# Patient Record
Sex: Female | Born: 1950
Health system: Southern US, Community
[De-identification: ages and names within clinical notes are randomized; demographics above are authoritative.]

## PROBLEM LIST (undated history)

## (undated) DIAGNOSIS — M810 Age-related osteoporosis without current pathological fracture: Secondary | ICD-10-CM

## (undated) DIAGNOSIS — I10 Essential (primary) hypertension: Secondary | ICD-10-CM

## (undated) DIAGNOSIS — E119 Type 2 diabetes mellitus without complications: Secondary | ICD-10-CM

## (undated) DIAGNOSIS — E785 Hyperlipidemia, unspecified: Secondary | ICD-10-CM

## (undated) DIAGNOSIS — R519 Headache, unspecified: Secondary | ICD-10-CM

## (undated) DIAGNOSIS — R51 Headache: Secondary | ICD-10-CM

## (undated) HISTORY — DX: Type 2 diabetes mellitus without complications: E11.9

## (undated) HISTORY — DX: Headache, unspecified: R51.9

## (undated) HISTORY — PX: EYE SURGERY: SHX253

## (undated) HISTORY — DX: Hyperlipidemia, unspecified: E78.5

## (undated) HISTORY — DX: Age-related osteoporosis without current pathological fracture: M81.0

## (undated) HISTORY — DX: Headache: R51

---

## 1998-10-10 ENCOUNTER — Encounter: Admission: RE | Admit: 1998-10-10 | Discharge: 1999-01-08 | Payer: Self-pay | Admitting: Family Medicine

## 1999-04-24 ENCOUNTER — Other Ambulatory Visit: Admission: RE | Admit: 1999-04-24 | Discharge: 1999-04-24 | Payer: Self-pay

## 2000-07-03 ENCOUNTER — Other Ambulatory Visit: Admission: RE | Admit: 2000-07-03 | Discharge: 2000-07-03 | Payer: Self-pay | Admitting: Family Medicine

## 2000-09-11 ENCOUNTER — Encounter: Payer: Self-pay | Admitting: Family Medicine

## 2000-09-11 ENCOUNTER — Encounter: Admission: RE | Admit: 2000-09-11 | Discharge: 2000-09-11 | Payer: Self-pay | Admitting: Family Medicine

## 2001-05-11 ENCOUNTER — Encounter: Payer: Self-pay | Admitting: Family Medicine

## 2001-05-11 ENCOUNTER — Encounter: Admission: RE | Admit: 2001-05-11 | Discharge: 2001-05-11 | Payer: Self-pay | Admitting: Family Medicine

## 2004-01-10 ENCOUNTER — Ambulatory Visit: Payer: Self-pay | Admitting: Gastroenterology

## 2004-01-23 ENCOUNTER — Ambulatory Visit: Payer: Self-pay | Admitting: Gastroenterology

## 2004-01-30 ENCOUNTER — Ambulatory Visit: Payer: Self-pay | Admitting: Gastroenterology

## 2004-10-22 ENCOUNTER — Emergency Department (HOSPITAL_COMMUNITY): Admission: EM | Admit: 2004-10-22 | Discharge: 2004-10-23 | Payer: Self-pay | Admitting: Emergency Medicine

## 2006-11-09 IMAGING — CT CT ANGIO CHEST
2 of 3 series · 8 of 33 positions shown · IV contrast (omnipaque)
Comparison: none

CLINICAL DATA: Chest pain.
 CT ANGIOGRAPHY OF CHEST:
TECHNIQUE: Multidetector CT imaging of the chest was performed during bolus injection of intravenous contrast.  Multiplanar CT angiographic image reconstructions were generated to evaluate the vascular anatomy.
 Contrast:  80 cc Omnipaque 300.
 There are no filling defects within the pulmonary arterial system to suggest pulmonary emboli.  There is no evidence of aortic dissection or aneurysm.  The lungs are clear except for minimal dependent and basilar atelectasis.  Indeterminate 6-mm right thyroid nodule is noted.  Consider ultrasound follow-up.  No enlarged lymph nodes are present.  No evidence of pleural or pericardial effusions.

[Series 2: pe 3.0 b40f st · axial · 0.52mm/px · z∈[+1236,+1245]mm · 2 of 81 slices shown]
[im 38/81  lung]
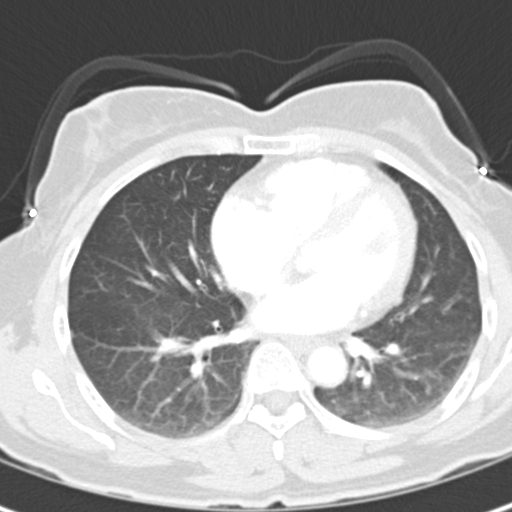
[im 41/81  lung]
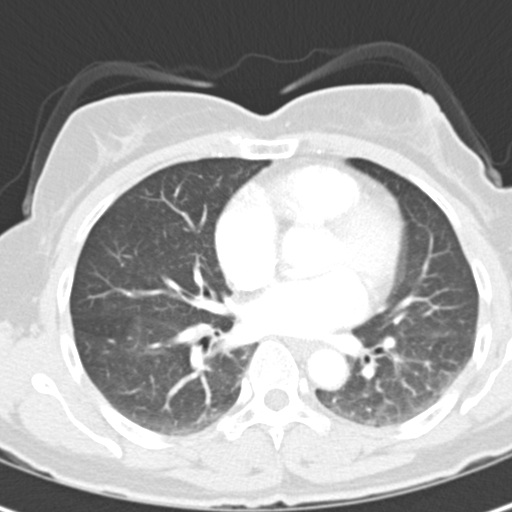

[Series 3: pe 1.0 b20f st · axial · 0.52mm/px · z∈[+1162,+1328]mm · 6 of 250 slices shown]
[im 42/250  lung]
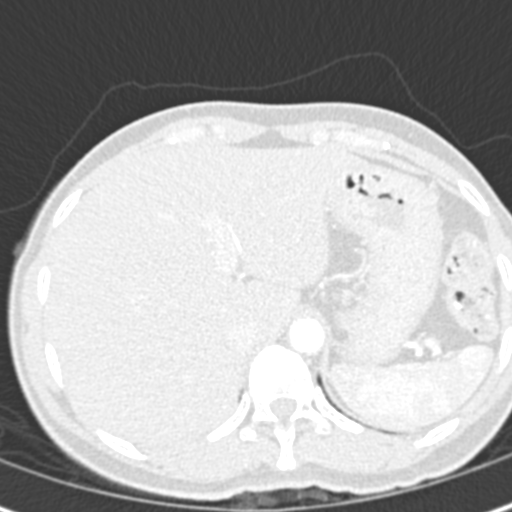
[im 84/250  mediastinal]
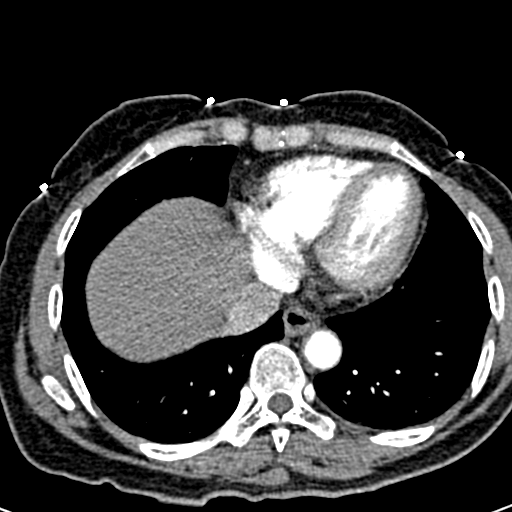
[im 116/250  lung]
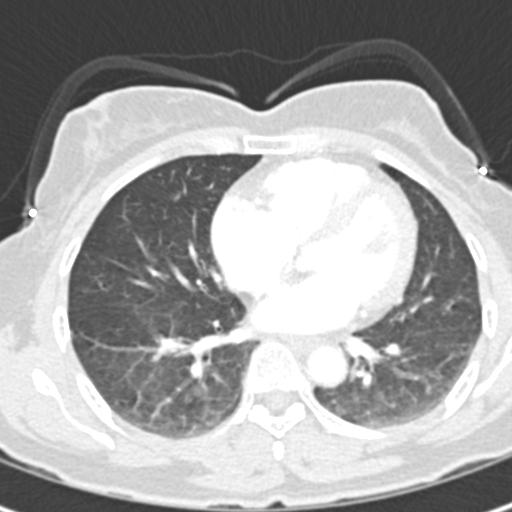
[im 125/250  mediastinal]
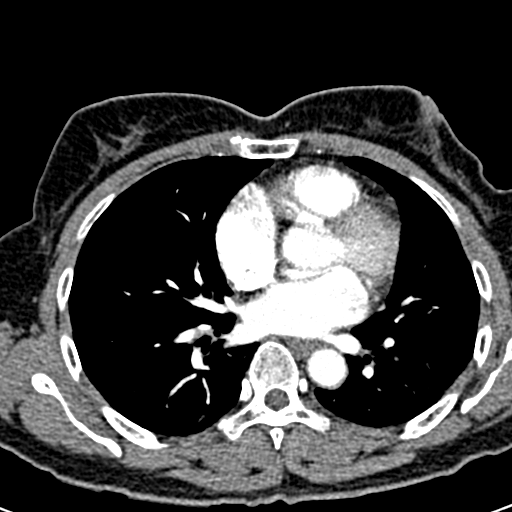
[im 167/250  lung]
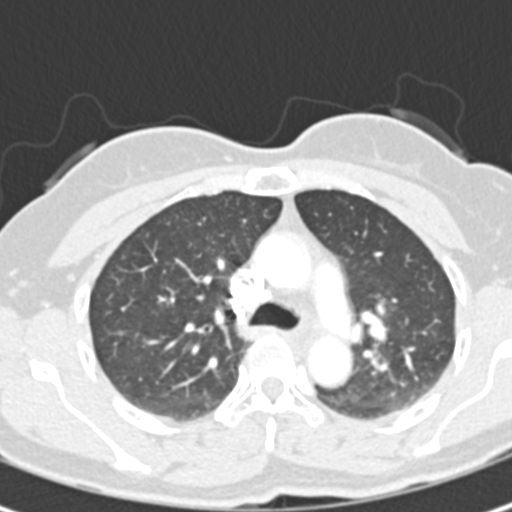
[im 208/250  mediastinal]
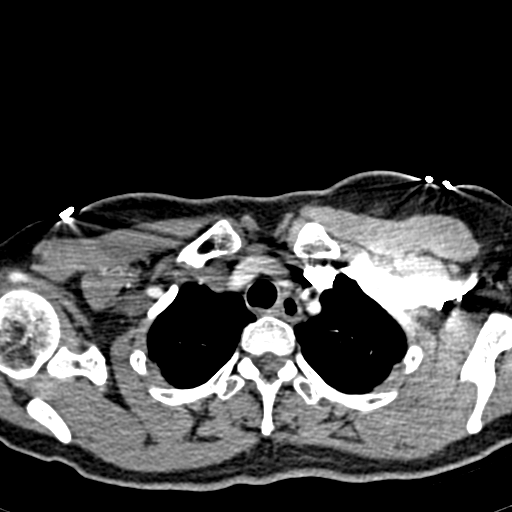

[8 of 33 positions shown; findings below may reference images not displayed]

IMPRESSION: 1.  No evidence of acute abnormality--no evidence of pulmonary emboli or aortic dissection.
 2.  Indeterminate 6-mm right thyroid nodule, consider ultrasound follow-up.

## 2012-09-14 ENCOUNTER — Other Ambulatory Visit: Payer: Self-pay

## 2012-09-14 DIAGNOSIS — Z1231 Encounter for screening mammogram for malignant neoplasm of breast: Secondary | ICD-10-CM

## 2012-10-05 ENCOUNTER — Ambulatory Visit
Admission: RE | Admit: 2012-10-05 | Discharge: 2012-10-05 | Disposition: A | Discharge status: Home / Self Care | Payer: BC Managed Care – PPO | Source: Ambulatory Visit

## 2012-10-05 DIAGNOSIS — Z1231 Encounter for screening mammogram for malignant neoplasm of breast: Secondary | ICD-10-CM

## 2013-11-02 ENCOUNTER — Other Ambulatory Visit (HOSPITAL_COMMUNITY)
Admission: RE | Admit: 2013-11-02 | Discharge: 2013-11-02 | Disposition: A | Discharge status: Home / Self Care | Payer: BC Managed Care – PPO | Source: Ambulatory Visit | Attending: Family Medicine | Admitting: Family Medicine

## 2013-11-02 ENCOUNTER — Other Ambulatory Visit: Payer: Self-pay | Admitting: Family Medicine

## 2013-11-02 DIAGNOSIS — Z1231 Encounter for screening mammogram for malignant neoplasm of breast: Secondary | ICD-10-CM

## 2013-11-02 DIAGNOSIS — Z124 Encounter for screening for malignant neoplasm of cervix: Secondary | ICD-10-CM | POA: Diagnosis present

## 2013-11-02 DIAGNOSIS — E2839 Other primary ovarian failure: Secondary | ICD-10-CM

## 2013-11-04 LAB — CYTOLOGY - PAP

## 2013-12-07 ENCOUNTER — Other Ambulatory Visit: Payer: BC Managed Care – PPO

## 2013-12-07 ENCOUNTER — Ambulatory Visit: Payer: BC Managed Care – PPO

## 2014-06-09 DIAGNOSIS — H9311 Tinnitus, right ear: Secondary | ICD-10-CM | POA: Diagnosis not present

## 2014-06-09 DIAGNOSIS — R809 Proteinuria, unspecified: Secondary | ICD-10-CM | POA: Diagnosis not present

## 2014-06-09 DIAGNOSIS — E780 Pure hypercholesterolemia: Secondary | ICD-10-CM | POA: Diagnosis not present

## 2014-06-09 DIAGNOSIS — R253 Fasciculation: Secondary | ICD-10-CM | POA: Diagnosis not present

## 2014-06-09 DIAGNOSIS — E782 Mixed hyperlipidemia: Secondary | ICD-10-CM | POA: Diagnosis not present

## 2014-06-09 DIAGNOSIS — E1129 Type 2 diabetes mellitus with other diabetic kidney complication: Secondary | ICD-10-CM | POA: Diagnosis not present

## 2014-06-13 DIAGNOSIS — E119 Type 2 diabetes mellitus without complications: Secondary | ICD-10-CM | POA: Diagnosis not present

## 2014-06-28 DIAGNOSIS — H6983 Other specified disorders of Eustachian tube, bilateral: Secondary | ICD-10-CM | POA: Diagnosis not present

## 2014-06-28 DIAGNOSIS — H908 Mixed conductive and sensorineural hearing loss, unspecified: Secondary | ICD-10-CM | POA: Diagnosis not present

## 2014-06-28 DIAGNOSIS — H9311 Tinnitus, right ear: Secondary | ICD-10-CM | POA: Diagnosis not present

## 2014-06-28 DIAGNOSIS — H903 Sensorineural hearing loss, bilateral: Secondary | ICD-10-CM | POA: Diagnosis not present

## 2014-09-08 DIAGNOSIS — Z1239 Encounter for other screening for malignant neoplasm of breast: Secondary | ICD-10-CM | POA: Diagnosis not present

## 2014-09-08 DIAGNOSIS — H9319 Tinnitus, unspecified ear: Secondary | ICD-10-CM | POA: Diagnosis not present

## 2014-09-08 DIAGNOSIS — Z1211 Encounter for screening for malignant neoplasm of colon: Secondary | ICD-10-CM | POA: Diagnosis not present

## 2014-09-08 DIAGNOSIS — E1129 Type 2 diabetes mellitus with other diabetic kidney complication: Secondary | ICD-10-CM | POA: Diagnosis not present

## 2014-09-08 DIAGNOSIS — E782 Mixed hyperlipidemia: Secondary | ICD-10-CM | POA: Diagnosis not present

## 2014-10-27 ENCOUNTER — Encounter: Payer: Self-pay | Admitting: Family Medicine

## 2014-10-27 DIAGNOSIS — H9319 Tinnitus, unspecified ear: Secondary | ICD-10-CM | POA: Diagnosis not present

## 2014-10-27 DIAGNOSIS — E1129 Type 2 diabetes mellitus with other diabetic kidney complication: Secondary | ICD-10-CM | POA: Diagnosis not present

## 2014-10-27 DIAGNOSIS — J029 Acute pharyngitis, unspecified: Secondary | ICD-10-CM | POA: Diagnosis not present

## 2014-10-27 DIAGNOSIS — Z23 Encounter for immunization: Secondary | ICD-10-CM | POA: Diagnosis not present

## 2014-10-27 DIAGNOSIS — R05 Cough: Secondary | ICD-10-CM | POA: Diagnosis not present

## 2014-10-27 DIAGNOSIS — E782 Mixed hyperlipidemia: Secondary | ICD-10-CM | POA: Diagnosis not present

## 2014-11-01 ENCOUNTER — Ambulatory Visit
Admission: RE | Admit: 2014-11-01 | Discharge: 2014-11-01 | Disposition: A | Discharge status: Home / Self Care | Payer: Medicaid Other | Source: Ambulatory Visit | Attending: Family Medicine | Admitting: Family Medicine

## 2014-11-01 DIAGNOSIS — Z780 Asymptomatic menopausal state: Secondary | ICD-10-CM | POA: Diagnosis not present

## 2014-11-01 DIAGNOSIS — E2839 Other primary ovarian failure: Secondary | ICD-10-CM

## 2014-11-01 DIAGNOSIS — Z1231 Encounter for screening mammogram for malignant neoplasm of breast: Secondary | ICD-10-CM | POA: Diagnosis not present

## 2014-11-04 DIAGNOSIS — E782 Mixed hyperlipidemia: Secondary | ICD-10-CM | POA: Diagnosis not present

## 2014-11-04 DIAGNOSIS — Z1211 Encounter for screening for malignant neoplasm of colon: Secondary | ICD-10-CM | POA: Diagnosis not present

## 2014-11-04 DIAGNOSIS — H547 Unspecified visual loss: Secondary | ICD-10-CM | POA: Diagnosis not present

## 2014-11-04 DIAGNOSIS — R809 Proteinuria, unspecified: Secondary | ICD-10-CM | POA: Diagnosis not present

## 2014-11-04 DIAGNOSIS — E780 Pure hypercholesterolemia: Secondary | ICD-10-CM | POA: Diagnosis not present

## 2014-11-04 DIAGNOSIS — Z1389 Encounter for screening for other disorder: Secondary | ICD-10-CM | POA: Diagnosis not present

## 2014-11-04 DIAGNOSIS — Z0000 Encounter for general adult medical examination without abnormal findings: Secondary | ICD-10-CM | POA: Diagnosis not present

## 2014-11-04 DIAGNOSIS — E1129 Type 2 diabetes mellitus with other diabetic kidney complication: Secondary | ICD-10-CM | POA: Diagnosis not present

## 2014-11-29 DIAGNOSIS — H25043 Posterior subcapsular polar age-related cataract, bilateral: Secondary | ICD-10-CM | POA: Diagnosis not present

## 2014-12-14 DIAGNOSIS — H25813 Combined forms of age-related cataract, bilateral: Secondary | ICD-10-CM | POA: Diagnosis not present

## 2014-12-15 DIAGNOSIS — H2511 Age-related nuclear cataract, right eye: Secondary | ICD-10-CM | POA: Diagnosis not present

## 2014-12-15 DIAGNOSIS — H268 Other specified cataract: Secondary | ICD-10-CM | POA: Diagnosis not present

## 2015-01-03 DIAGNOSIS — H2512 Age-related nuclear cataract, left eye: Secondary | ICD-10-CM | POA: Diagnosis not present

## 2015-01-05 DIAGNOSIS — H268 Other specified cataract: Secondary | ICD-10-CM | POA: Diagnosis not present

## 2015-01-05 DIAGNOSIS — H2512 Age-related nuclear cataract, left eye: Secondary | ICD-10-CM | POA: Diagnosis not present

## 2015-01-30 DIAGNOSIS — M810 Age-related osteoporosis without current pathological fracture: Secondary | ICD-10-CM | POA: Diagnosis not present

## 2015-01-30 DIAGNOSIS — R809 Proteinuria, unspecified: Secondary | ICD-10-CM | POA: Diagnosis not present

## 2015-01-30 DIAGNOSIS — Z7984 Long term (current) use of oral hypoglycemic drugs: Secondary | ICD-10-CM | POA: Diagnosis not present

## 2015-01-30 DIAGNOSIS — E782 Mixed hyperlipidemia: Secondary | ICD-10-CM | POA: Diagnosis not present

## 2015-01-30 DIAGNOSIS — Z23 Encounter for immunization: Secondary | ICD-10-CM | POA: Diagnosis not present

## 2015-01-30 DIAGNOSIS — E1129 Type 2 diabetes mellitus with other diabetic kidney complication: Secondary | ICD-10-CM | POA: Diagnosis not present

## 2015-02-08 ENCOUNTER — Other Ambulatory Visit: Payer: Self-pay | Admitting: Gastroenterology

## 2015-02-08 DIAGNOSIS — Z1211 Encounter for screening for malignant neoplasm of colon: Secondary | ICD-10-CM | POA: Diagnosis not present

## 2015-02-08 DIAGNOSIS — D124 Benign neoplasm of descending colon: Secondary | ICD-10-CM | POA: Diagnosis not present

## 2015-02-08 LAB — HM COLONOSCOPY

## 2015-02-16 ENCOUNTER — Ambulatory Visit: Payer: Medicaid Other | Admitting: Endocrinology

## 2015-05-01 DIAGNOSIS — Z7984 Long term (current) use of oral hypoglycemic drugs: Secondary | ICD-10-CM | POA: Diagnosis not present

## 2015-05-01 DIAGNOSIS — E7800 Pure hypercholesterolemia, unspecified: Secondary | ICD-10-CM | POA: Diagnosis not present

## 2015-05-01 DIAGNOSIS — R1013 Epigastric pain: Secondary | ICD-10-CM | POA: Diagnosis not present

## 2015-05-01 DIAGNOSIS — E1165 Type 2 diabetes mellitus with hyperglycemia: Secondary | ICD-10-CM | POA: Diagnosis not present

## 2015-05-01 DIAGNOSIS — R809 Proteinuria, unspecified: Secondary | ICD-10-CM | POA: Diagnosis not present

## 2015-05-01 DIAGNOSIS — M810 Age-related osteoporosis without current pathological fracture: Secondary | ICD-10-CM | POA: Diagnosis not present

## 2015-11-09 ENCOUNTER — Other Ambulatory Visit: Payer: Self-pay | Admitting: Family Medicine

## 2015-11-09 ENCOUNTER — Other Ambulatory Visit (HOSPITAL_COMMUNITY)
Admission: RE | Admit: 2015-11-09 | Discharge: 2015-11-09 | Disposition: A | Discharge status: Home / Self Care | Payer: Medicare Other | Source: Ambulatory Visit | Attending: Family Medicine | Admitting: Family Medicine

## 2015-11-09 DIAGNOSIS — M810 Age-related osteoporosis without current pathological fracture: Secondary | ICD-10-CM | POA: Diagnosis not present

## 2015-11-09 DIAGNOSIS — E782 Mixed hyperlipidemia: Secondary | ICD-10-CM | POA: Diagnosis not present

## 2015-11-09 DIAGNOSIS — Z1231 Encounter for screening mammogram for malignant neoplasm of breast: Secondary | ICD-10-CM | POA: Diagnosis not present

## 2015-11-09 DIAGNOSIS — Z124 Encounter for screening for malignant neoplasm of cervix: Secondary | ICD-10-CM | POA: Insufficient documentation

## 2015-11-09 DIAGNOSIS — E1165 Type 2 diabetes mellitus with hyperglycemia: Secondary | ICD-10-CM | POA: Diagnosis not present

## 2015-11-09 DIAGNOSIS — Z7984 Long term (current) use of oral hypoglycemic drugs: Secondary | ICD-10-CM | POA: Diagnosis not present

## 2015-11-09 DIAGNOSIS — Z1389 Encounter for screening for other disorder: Secondary | ICD-10-CM | POA: Diagnosis not present

## 2015-11-09 DIAGNOSIS — Z23 Encounter for immunization: Secondary | ICD-10-CM | POA: Diagnosis not present

## 2015-11-09 DIAGNOSIS — E1129 Type 2 diabetes mellitus with other diabetic kidney complication: Secondary | ICD-10-CM | POA: Diagnosis not present

## 2015-11-09 DIAGNOSIS — R809 Proteinuria, unspecified: Secondary | ICD-10-CM | POA: Diagnosis not present

## 2015-11-09 DIAGNOSIS — Z0000 Encounter for general adult medical examination without abnormal findings: Secondary | ICD-10-CM | POA: Diagnosis not present

## 2015-11-10 DIAGNOSIS — M810 Age-related osteoporosis without current pathological fracture: Secondary | ICD-10-CM | POA: Diagnosis not present

## 2015-11-10 DIAGNOSIS — E1165 Type 2 diabetes mellitus with hyperglycemia: Secondary | ICD-10-CM | POA: Diagnosis not present

## 2015-11-10 DIAGNOSIS — Z7984 Long term (current) use of oral hypoglycemic drugs: Secondary | ICD-10-CM | POA: Diagnosis not present

## 2015-11-10 DIAGNOSIS — Z124 Encounter for screening for malignant neoplasm of cervix: Secondary | ICD-10-CM | POA: Diagnosis not present

## 2015-11-10 DIAGNOSIS — Z1389 Encounter for screening for other disorder: Secondary | ICD-10-CM | POA: Diagnosis not present

## 2015-11-10 DIAGNOSIS — Z0000 Encounter for general adult medical examination without abnormal findings: Secondary | ICD-10-CM | POA: Diagnosis not present

## 2015-11-10 DIAGNOSIS — Z23 Encounter for immunization: Secondary | ICD-10-CM | POA: Diagnosis not present

## 2015-11-10 DIAGNOSIS — E782 Mixed hyperlipidemia: Secondary | ICD-10-CM | POA: Diagnosis not present

## 2015-11-10 DIAGNOSIS — E1129 Type 2 diabetes mellitus with other diabetic kidney complication: Secondary | ICD-10-CM | POA: Diagnosis not present

## 2015-11-10 DIAGNOSIS — R809 Proteinuria, unspecified: Secondary | ICD-10-CM | POA: Diagnosis not present

## 2015-11-10 LAB — CYTOLOGY - PAP

## 2016-01-04 ENCOUNTER — Encounter: Payer: Self-pay | Admitting: Internal Medicine

## 2016-02-15 ENCOUNTER — Encounter: Payer: Self-pay | Admitting: Endocrinology

## 2016-02-15 ENCOUNTER — Ambulatory Visit (INDEPENDENT_AMBULATORY_CARE_PROVIDER_SITE_OTHER): Payer: Medicare Other | Admitting: Endocrinology

## 2016-02-15 DIAGNOSIS — R51 Headache: Secondary | ICD-10-CM | POA: Diagnosis not present

## 2016-02-15 DIAGNOSIS — M810 Age-related osteoporosis without current pathological fracture: Secondary | ICD-10-CM

## 2016-02-15 DIAGNOSIS — E1165 Type 2 diabetes mellitus with hyperglycemia: Secondary | ICD-10-CM | POA: Insufficient documentation

## 2016-02-15 DIAGNOSIS — E785 Hyperlipidemia, unspecified: Secondary | ICD-10-CM | POA: Diagnosis not present

## 2016-02-15 DIAGNOSIS — E139 Other specified diabetes mellitus without complications: Secondary | ICD-10-CM

## 2016-02-15 DIAGNOSIS — E119 Type 2 diabetes mellitus without complications: Secondary | ICD-10-CM | POA: Insufficient documentation

## 2016-02-15 DIAGNOSIS — R519 Headache, unspecified: Secondary | ICD-10-CM | POA: Insufficient documentation

## 2016-02-15 LAB — POCT GLYCOSYLATED HEMOGLOBIN (HGB A1C): Hemoglobin A1C: 8.7

## 2016-02-15 MED ORDER — PIOGLITAZONE HCL 45 MG PO TABS: 45.0000 mg | ORAL_TABLET | Freq: Every day | ORAL | 3 refills | Status: DC

## 2016-02-15 MED ORDER — SITAGLIPTIN PHOSPHATE 100 MG PO TABS: 100.0000 mg | ORAL_TABLET | Freq: Every day | ORAL | 3 refills | Status: DC

## 2016-02-15 MED ORDER — GLIPIZIDE 10 MG PO TABS: 10.0000 mg | ORAL_TABLET | Freq: Two times a day (BID) | ORAL | 11 refills | Status: DC

## 2016-02-15 MED ORDER — METFORMIN HCL 1000 MG PO TABS: 1000.0000 mg | ORAL_TABLET | Freq: Two times a day (BID) | ORAL | 3 refills | Status: DC

## 2016-02-15 NOTE — Progress Notes (Signed)
Subjective:    Patient ID: Cindy Guerrero, female    DOB: 1950/12/07, 65 y.o.   MRN: [id number]  HPI pt is referred by Dr Jacelyn Grip, for diabetes.  Pt states DM was dx'ed in 1998; she has mild if any neuropathy of the lower extremities; she is unaware of any associated chronic complications; she has never been on insulin; pt says his diet and exercise are good; she has never had GDM, pancreatitis, severe hypoglycemia or DKA.  She stopped 3 DM meds, 5 mos ago, and resumed 2-3 mos ago.   Past Medical History:  Diagnosis Date  . Diabetes ([place])   . Dyslipidemia   . Headache   . Osteoporosis     No past surgical history on file.  Social History   Social History  . Marital status: Married    Spouse name: N/A  . Number of children: N/A  . Years of education: N/A   Occupational History  . Not on file.   Social History Main Topics  . Smoking status: Never Smoker  . Smokeless tobacco: Never Used  . Alcohol use No  . Drug use: Unknown  . Sexual activity: Not on file   Other Topics Concern  . Not on file   Social History Narrative  . No narrative on file    No current outpatient prescriptions on file prior to visit.   No current facility-administered medications on file prior to visit.     No Known Allergies  Family History  Problem Relation Age of Onset  . Diabetes Mother     BP 124/86   Pulse 93   Ht 4\' 11"  (1.499 m)   Wt 104 lb (47.2 kg)   SpO2 97%   BMI 21.01 kg/m    Review of Systems denies weight loss, blurry vision, headache, chest pain, sob, n/v, urinary frequency, muscle cramps, excessive diaphoresis, depression, cold intolerance, rhinorrhea, and easy bruising.  She has frequent urination.     Objective:   Physical Exam VS: see vs page GEN: no distress HEAD: head: no deformity eyes: no periorbital swelling, no proptosis external nose and ears are normal mouth: no lesion seen NECK: supple, thyroid is not enlarged CHEST WALL: no deformity LUNGS: clear to  auscultation CV: reg rate and rhythm, no murmur ABD: abdomen is soft, nontender.  no hepatosplenomegaly.  not distended.  no hernia MUSCULOSKELETAL: muscle bulk and strength are grossly normal.  no obvious joint swelling.  gait is normal and steady EXTEMITIES: no deformity.  no ulcer on the feet.  feet are of normal color and temp.  no edema.  There is bilateral onychomycosis of the toenails.   PULSES: dorsalis pedis intact bilat.  no carotid bruit NEURO:  cn 2-12 grossly intact.   readily moves all 4's.  sensation is intact to touch on the feet.  SKIN:  Normal texture and temperature.  No rash or suspicious lesion is visible.   NODES:  None palpable at the neck PSYCH: alert, well-oriented.  Does not appear anxious nor depressed.  A1c=8.7%  I have reviewed outside records, and summarized: Pt was noted to have elevated a1c, and referred here.  Dr Drema Dallas says pt reported she was taking meds as rx'ed as of 11/10/15     Assessment & Plan:  Type 2 DM: she needs increased rx.  There are variable reports as to whether or not she has been taking meds, so I have refilled all 3 today.   Patient is advised the following: Patient Instructions  good diet and exercise significantly improve the control of your diabetes.  please let me know if you wish to be referred to a dietician.  high blood sugar is very risky to your health.  you should see an eye doctor and dentist every year.  It is very important to get all recommended vaccinations.  Controlling your blood pressure and cholesterol drastically reduces the damage diabetes does to your body.  Those who smoke should quit.  Please discuss these with your doctor.  check your blood sugar once a day.  vary the time of day when you check, between before the 3 meals, and at bedtime.  also check if you have symptoms of your blood sugar being too high or too low.  please keep a record of the readings and bring it to your next appointment here (or you can bring the  meter itself).  You can write it on any piece of paper.  please call us sooner if your blood sugar goes below 70, or if you have a lot of readings over 200. I have sent a prescription to your pharmacy, for an additional diabetes pill. Please come back for a follow-up appointment in 6 weeks.  Please see Vaughan Basta the same day, to learn about insulin, in case you need it.

## 2016-02-15 NOTE — Patient Instructions (Addendum)
good diet and exercise significantly improve the control of your diabetes.  please let me know if you wish to be referred to a dietician.  high blood sugar is very risky to your health.  you should see an eye doctor and dentist every year.  It is very important to get all recommended vaccinations.  Controlling your blood pressure and cholesterol drastically reduces the damage diabetes does to your body.  Those who smoke should quit.  Please discuss these with your doctor.  check your blood sugar once a day.  vary the time of day when you check, between before the 3 meals, and at bedtime.  also check if you have symptoms of your blood sugar being too high or too low.  please keep a record of the readings and bring it to your next appointment here (or you can bring the meter itself).  You can write it on any piece of paper.  please call us sooner if your blood sugar goes below 70, or if you have a lot of readings over 200. I have sent a prescription to your pharmacy, for an additional diabetes pill. Please come back for a follow-up appointment in 6 weeks.  Please see Vaughan Basta the same day, to learn about insulin, in case you need it.

## 2016-04-02 ENCOUNTER — Encounter: Payer: Medicare Other | Admitting: Nutrition

## 2016-04-02 ENCOUNTER — Ambulatory Visit: Payer: Medicare Other | Admitting: Endocrinology

## 2016-04-09 ENCOUNTER — Encounter: Payer: Medicare Other | Attending: Endocrinology | Admitting: Nutrition

## 2016-04-09 DIAGNOSIS — E119 Type 2 diabetes mellitus without complications: Secondary | ICD-10-CM

## 2016-04-09 DIAGNOSIS — E139 Other specified diabetes mellitus without complications: Secondary | ICD-10-CM | POA: Insufficient documentation

## 2016-04-09 DIAGNOSIS — Z713 Dietary counseling and surveillance: Secondary | ICD-10-CM | POA: Diagnosis not present

## 2016-04-09 NOTE — Patient Instructions (Signed)
Continue walking Continue to stop all sweet drinks and fruit juices

## 2016-04-09 NOTE — Progress Notes (Signed)
This patient was instructed on how to take insulin via an interpreter.  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  We reviewed low blood sugars--symptoms and treatments, and she reported good understanding.   She reports that she has stopped all sweet drinks and juices,and eats on vegetables and beans, with very little rice or noodles.   She says she does not have a meter to test her blood sugar.  She was given a One Touch Verio Flex and shown how to use it.  She tested her blood sugar and it was 182 2 hours after lunch today  She was told to test once a day--varying the time before meals or at bedtime.  She agreed to do this. She reports walking for 1 hour each day.

## 2016-04-10 ENCOUNTER — Other Ambulatory Visit: Payer: Self-pay

## 2016-04-10 MED ORDER — GLUCOSE BLOOD VI STRP: ORAL_STRIP | 2 refills | Status: DC

## 2016-04-10 MED ORDER — ONETOUCH DELICA LANCETS 33G MISC: 2 refills | Status: DC

## 2016-07-16 ENCOUNTER — Encounter: Payer: Self-pay | Admitting: Endocrinology

## 2016-07-16 ENCOUNTER — Ambulatory Visit (INDEPENDENT_AMBULATORY_CARE_PROVIDER_SITE_OTHER): Payer: Medicare Other | Admitting: Endocrinology

## 2016-07-16 VITALS — BP 138/82 | HR 89 | Ht 59.0 in | Wt 110.0 lb

## 2016-07-16 DIAGNOSIS — E139 Other specified diabetes mellitus without complications: Secondary | ICD-10-CM

## 2016-07-16 LAB — POCT GLYCOSYLATED HEMOGLOBIN (HGB A1C): Hemoglobin A1C: 6.7

## 2016-07-16 MED ORDER — GLIPIZIDE 10 MG PO TABS: 10.0000 mg | ORAL_TABLET | Freq: Every day | ORAL | 3 refills | Status: DC

## 2016-07-16 NOTE — Progress Notes (Signed)
Subjective:    Patient ID: Cindy Guerrero, female    DOB: 1951/01/18, 66 y.o.   MRN: [id number]  HPI Pt returns for f/u of diabetes mellitus: DM type: 2 Dx'ed: 7425 Complications: none Therapy: 4 oral meds GDM: never DKA: never Severe hypoglycemia: never Pancreatitis: never Other: she has never been on insulin Interval history: She says cbg's are in the low-100's.  It is in general higher as the day goes on.  pt states she feels well in general.   Past Medical History:  Diagnosis Date  . Diabetes ([place])   . Dyslipidemia   . Headache   . Osteoporosis     No past surgical history on file.  Social History   Social History  . Marital status: Married    Spouse name: N/A  . Number of children: N/A  . Years of education: N/A   Occupational History  . Not on file.   Social History Main Topics  . Smoking status: Never Smoker  . Smokeless tobacco: Never Used  . Alcohol use No  . Drug use: Unknown  . Sexual activity: Not on file   Other Topics Concern  . Not on file   Social History Narrative  . No narrative on file    Current Outpatient Prescriptions on File Prior to Visit  Medication Sig Dispense Refill  . Calcium Carb-Cholecalciferol 600-800 MG-UNIT TABS Take by mouth.    Marland Kitchen gemfibrozil (LOPID) 600 MG tablet Take 600 mg by mouth 2 (two) times daily before a meal.    . glucose blood (ONETOUCH VERIO) test strip Use to check blood sugar 1 time per day Dx code E11.9 100 each 2  . metFORMIN (GLUCOPHAGE) 1000 MG tablet Take 1 tablet (1,000 mg total) by mouth 2 (two) times daily with a meal. 180 tablet 3  . ONETOUCH DELICA LANCETS 95G MISC Use to check blood sugar 1 time per day. Dx code : E11.9 100 each 2  . pioglitazone (ACTOS) 45 MG tablet Take 1 tablet (45 mg total) by mouth daily. 90 tablet 3  . sitaGLIPtin (JANUVIA) 100 MG tablet Take 1 tablet (100 mg total) by mouth daily. 90 tablet 3  . vitamin E (VITAMIN E) 400 UNIT capsule Take 400 Units by mouth daily.    . vitamin  E 1000 UNIT capsule Take 1,000 Units by mouth daily.    Marland Kitchen losartan (COZAAR) 25 MG tablet Take 25 mg by mouth daily.     No current facility-administered medications on file prior to visit.     No Known Allergies  Family History  Problem Relation Age of Onset  . Diabetes Mother     BP 138/82   Pulse 89   Ht 4\' 11"  (1.499 m)   Wt 110 lb (49.9 kg)   SpO2 96%   BMI 22.22 kg/m    Review of Systems She denies hypoglycemia.      Objective:   Physical Exam VITAL SIGNS:  See vs page GENERAL: no distress Pulses: dorsalis pedis intact bilat.   MSK: no deformity of the feet CV: no leg edema Skin:  no ulcer on the feet.  normal color and temp on the feet. Neuro: sensation is intact to touch on the feet.    A1c=6.8%    Assessment & Plan:  Type 2 DM: overcontrolled, for this SU-containing regimen.   Patient Instructions  check your blood sugar once a day.  vary the time of day when you check, between before the 3 meals, and at  bedtime.  also check if you have symptoms of your blood sugar being too high or too low.  please keep a record of the readings and bring it to your next appointment here (or you can bring the meter itself).  You can write it on any piece of paper.  please call us sooner if your blood sugar goes below 70, or if you have a lot of readings over 200. I have sent a prescription to your pharmacy, to reduce the glipizide to 1 pill in the morning, and none in the evening. Please continue the same other diabetes medications Please come back for a follow-up appointment in 3 months.

## 2016-07-16 NOTE — Patient Instructions (Addendum)
check your blood sugar once a day.  vary the time of day when you check, between before the 3 meals, and at bedtime.  also check if you have symptoms of your blood sugar being too high or too low.  please keep a record of the readings and bring it to your next appointment here (or you can bring the meter itself).  You can write it on any piece of paper.  please call us sooner if your blood sugar goes below 70, or if you have a lot of readings over 200. I have sent a prescription to your pharmacy, to reduce the glipizide to 1 pill in the morning, and none in the evening. Please continue the same other diabetes medications Please come back for a follow-up appointment in 3 months.

## 2016-10-16 ENCOUNTER — Ambulatory Visit (INDEPENDENT_AMBULATORY_CARE_PROVIDER_SITE_OTHER): Payer: Medicare Other | Admitting: Endocrinology

## 2016-10-16 ENCOUNTER — Encounter: Payer: Self-pay | Admitting: Endocrinology

## 2016-10-16 VITALS — BP 136/74 | HR 91 | Wt 110.8 lb

## 2016-10-16 DIAGNOSIS — E139 Other specified diabetes mellitus without complications: Secondary | ICD-10-CM | POA: Diagnosis not present

## 2016-10-16 LAB — POCT GLYCOSYLATED HEMOGLOBIN (HGB A1C): Hemoglobin A1C: 6.6

## 2016-10-16 MED ORDER — GLIPIZIDE 10 MG PO TABS: 10.0000 mg | ORAL_TABLET | Freq: Every day | ORAL | 3 refills | Status: DC

## 2016-10-16 NOTE — Progress Notes (Signed)
Subjective:    Patient ID: Cindy Guerrero, female    DOB: 02/04/1951, 66 y.o.   MRN: [id number]  HPI Pt returns for f/u of diabetes mellitus: DM type: 2 Dx'ed: 6789 Complications: none Therapy: 4 oral meds GDM: never DKA: never Severe hypoglycemia: never Pancreatitis: never Other: she has never been on insulin Interval history: She says cbg's are in the low-100's.  It is in general lowest fasting.  pt states she feels well in general.  She still takes glipizide 10 mg-BID.   Past Medical History:  Diagnosis Date  . Diabetes ([place])   . Dyslipidemia   . Headache   . Osteoporosis     No past surgical history on file.  Social History   Social History  . Marital status: Married    Spouse name: N/A  . Number of children: N/A  . Years of education: N/A   Occupational History  . Not on file.   Social History Main Topics  . Smoking status: Never Smoker  . Smokeless tobacco: Never Used  . Alcohol use No  . Drug use: Unknown  . Sexual activity: Not on file   Other Topics Concern  . Not on file   Social History Narrative  . No narrative on file    Current Outpatient Prescriptions on File Prior to Visit  Medication Sig Dispense Refill  . alendronate (FOSAMAX) 70 MG tablet Take 70 mg by mouth once a week. Take with a full glass of water on an empty stomach.    . Calcium Carb-Cholecalciferol 600-800 MG-UNIT TABS Take by mouth.    Marland Kitchen gemfibrozil (LOPID) 600 MG tablet Take 600 mg by mouth 2 (two) times daily before a meal.    . glucose blood (ONETOUCH VERIO) test strip Use to check blood sugar 1 time per day Dx code E11.9 100 each 2  . losartan (COZAAR) 25 MG tablet Take 25 mg by mouth daily.    . metFORMIN (GLUCOPHAGE) 1000 MG tablet Take 1 tablet (1,000 mg total) by mouth 2 (two) times daily with a meal. 180 tablet 3  . ONETOUCH DELICA LANCETS 38B MISC Use to check blood sugar 1 time per day. Dx code : E11.9 100 each 2  . pioglitazone (ACTOS) 45 MG tablet Take 1 tablet (45 mg  total) by mouth daily. 90 tablet 3  . sitaGLIPtin (JANUVIA) 100 MG tablet Take 1 tablet (100 mg total) by mouth daily. 90 tablet 3  . vitamin E (VITAMIN E) 400 UNIT capsule Take 400 Units by mouth daily.    . vitamin E 1000 UNIT capsule Take 1,000 Units by mouth daily.     No current facility-administered medications on file prior to visit.     No Known Allergies  Family History  Problem Relation Age of Onset  . Diabetes Mother     BP 136/74   Pulse 91   Wt 110 lb 12.8 oz (50.3 kg)   SpO2 97%   BMI 22.38 kg/m    Review of Systems     Objective:   Physical Exam VITAL SIGNS:  See vs page GENERAL: no distress Pulses: foot pulses are intact bilaterally.   MSK: no deformity of the feet or ankles.  CV: no edema of the legs or ankles Skin:  no ulcer on the feet or ankles.  normal color and temp on the feet and ankles Neuro: sensation is intact to touch on the feet and ankles.   Ext: There is bilateral onychomycosis of the toenails  Lab Results  Component Value Date   HGBA1C 6.6 10/16/2016      Assessment & Plan:  Type 2 DM: overcontrolled, for this SU-containing regimen  Patient Instructions  check your blood sugar once a day.  vary the time of day when you check, between before the 3 meals, and at bedtime.  also check if you have symptoms of your blood sugar being too high or too low.  please keep a record of the readings and bring it to your next appointment here (or you can bring the meter itself).  You can write it on any piece of paper.  please call us sooner if your blood sugar goes below 70, or if you have a lot of readings over 200. I have sent a prescription to your pharmacy, to reduce the glipizide to 1 pill in the morning, and none in the evening. Please continue the same other diabetes medications.  Please come back for a follow-up appointment in 3 months.

## 2016-10-16 NOTE — Patient Instructions (Addendum)
check your blood sugar once a day.  vary the time of day when you check, between before the 3 meals, and at bedtime.  also check if you have symptoms of your blood sugar being too high or too low.  please keep a record of the readings and bring it to your next appointment here (or you can bring the meter itself).  You can write it on any piece of paper.  please call us sooner if your blood sugar goes below 70, or if you have a lot of readings over 200. I have sent a prescription to your pharmacy, to reduce the glipizide to 1 pill in the morning, and none in the evening. Please continue the same other diabetes medications.  Please come back for a follow-up appointment in 3 months.

## 2016-11-11 DIAGNOSIS — E1129 Type 2 diabetes mellitus with other diabetic kidney complication: Secondary | ICD-10-CM | POA: Diagnosis not present

## 2016-11-11 DIAGNOSIS — Z23 Encounter for immunization: Secondary | ICD-10-CM | POA: Diagnosis not present

## 2016-11-11 DIAGNOSIS — Z0000 Encounter for general adult medical examination without abnormal findings: Secondary | ICD-10-CM | POA: Diagnosis not present

## 2016-11-11 DIAGNOSIS — E782 Mixed hyperlipidemia: Secondary | ICD-10-CM | POA: Diagnosis not present

## 2016-11-11 DIAGNOSIS — Z1389 Encounter for screening for other disorder: Secondary | ICD-10-CM | POA: Diagnosis not present

## 2016-11-11 DIAGNOSIS — M810 Age-related osteoporosis without current pathological fracture: Secondary | ICD-10-CM | POA: Diagnosis not present

## 2016-11-11 DIAGNOSIS — Z7984 Long term (current) use of oral hypoglycemic drugs: Secondary | ICD-10-CM | POA: Diagnosis not present

## 2016-11-11 DIAGNOSIS — R809 Proteinuria, unspecified: Secondary | ICD-10-CM | POA: Diagnosis not present

## 2016-11-14 ENCOUNTER — Other Ambulatory Visit: Payer: Self-pay | Admitting: Family Medicine

## 2016-11-14 DIAGNOSIS — Z1231 Encounter for screening mammogram for malignant neoplasm of breast: Secondary | ICD-10-CM

## 2016-11-14 DIAGNOSIS — M810 Age-related osteoporosis without current pathological fracture: Secondary | ICD-10-CM

## 2016-12-03 ENCOUNTER — Other Ambulatory Visit: Payer: Medicare Other

## 2016-12-03 ENCOUNTER — Ambulatory Visit: Payer: Medicare Other

## 2016-12-09 ENCOUNTER — Other Ambulatory Visit: Payer: Self-pay | Admitting: Endocrinology

## 2016-12-20 ENCOUNTER — Other Ambulatory Visit: Payer: Self-pay

## 2016-12-20 MED ORDER — PIOGLITAZONE HCL 45 MG PO TABS: 45.0000 mg | ORAL_TABLET | Freq: Every day | ORAL | 3 refills | Status: DC

## 2017-01-16 ENCOUNTER — Ambulatory Visit: Payer: Medicare Other | Admitting: Endocrinology

## 2017-07-10 ENCOUNTER — Encounter: Payer: Self-pay | Admitting: Endocrinology

## 2017-07-10 ENCOUNTER — Ambulatory Visit (INDEPENDENT_AMBULATORY_CARE_PROVIDER_SITE_OTHER): Payer: Medicare Other | Admitting: Endocrinology

## 2017-07-10 VITALS — BP 128/68 | HR 88 | Wt 104.8 lb

## 2017-07-10 DIAGNOSIS — E139 Other specified diabetes mellitus without complications: Secondary | ICD-10-CM | POA: Diagnosis not present

## 2017-07-10 LAB — POCT GLYCOSYLATED HEMOGLOBIN (HGB A1C): HEMOGLOBIN A1C: 8.1

## 2017-07-10 MED ORDER — SITAGLIPTIN PHOSPHATE 100 MG PO TABS: 100.0000 mg | ORAL_TABLET | Freq: Every day | ORAL | 3 refills | Status: DC

## 2017-07-10 MED ORDER — METFORMIN HCL 1000 MG PO TABS: 1000.0000 mg | ORAL_TABLET | Freq: Two times a day (BID) | ORAL | 3 refills | Status: DC

## 2017-07-10 MED ORDER — PIOGLITAZONE HCL 45 MG PO TABS: 45.0000 mg | ORAL_TABLET | Freq: Every day | ORAL | 3 refills | Status: DC

## 2017-07-10 MED ORDER — GLIPIZIDE ER 2.5 MG PO TB24: 2.5000 mg | ORAL_TABLET | Freq: Every day | ORAL | 3 refills | Status: DC

## 2017-07-10 NOTE — Patient Instructions (Addendum)
check your blood sugar once a day.  vary the time of day when you check, between before the 3 meals, and at bedtime.  also check if you have symptoms of your blood sugar being too high or too low.  please keep a record of the readings and bring it to your next appointment here (or you can bring the meter itself).  You can write it on any piece of paper.  please call us sooner if your blood sugar goes below 70, or if you have a lot of readings over 200. I have sent a prescription to your pharmacy, to reduce the glipizide to 2.5 in the morning, and none in the evening. Please continue the same other diabetes medications. I have refilled all 3.   Please come back for a follow-up appointment in 2 months.

## 2017-07-10 NOTE — Progress Notes (Signed)
Subjective:    Patient ID: Cindy Guerrero, female    DOB: [birth year], 67 y.o.   MRN: [id number]  HPI Pt returns for f/u of diabetes mellitus: DM type: 2 Dx'ed: 6294 Complications: none Therapy: 4 oral meds GDM: never DKA: never Severe hypoglycemia: never Pancreatitis: never Other: she has never been on insulin, but she has learned how.   Interval history: She says cbg's are in the 100's.  pt states she feels well in general.  She still takes glipizide and metformin, but she does not take pioglitizone or [place].  She recently returned from [place].   Past Medical History:  Diagnosis Date  . Diabetes ([place])   . Dyslipidemia   . Headache   . Osteoporosis      Social History   Socioeconomic History  . Marital status: Married    Spouse name: Not on file  . Number of children: Not on file  . Years of education: Not on file  . Highest education level: Not on file  Occupational History  . Not on file  Social Needs  . Financial resource strain: Not on file  . Food insecurity:    Worry: Not on file    Inability: Not on file  . Transportation needs:    Medical: Not on file    Non-medical: Not on file  Tobacco Use  . Smoking status: Never Smoker  . Smokeless tobacco: Never Used  Substance and Sexual Activity  . Alcohol use: No  . Drug use: Not on file  . Sexual activity: Not on file  Lifestyle  . Physical activity:    Days per week: Not on file    Minutes per session: Not on file  . Stress: Not on file  Relationships  . Social connections:    Talks on phone: Not on file    Gets together: Not on file    Attends religious service: Not on file    Active member of club or organization: Not on file    Attends meetings of clubs or organizations: Not on file    Relationship status: Not on file  . Intimate partner violence:    Fear of current or ex partner: Not on file    Emotionally abused: Not on file    Physically abused: Not on file    Forced sexual activity: Not on  file  Other Topics Concern  . Not on file  Social History Narrative  . Not on file    Current Outpatient Medications on File Prior to Visit  Medication Sig Dispense Refill  . alendronate (FOSAMAX) 70 MG tablet Take 70 mg by mouth once a week. Take with a full glass of water on an empty stomach.    . Calcium Carb-Cholecalciferol 600-800 MG-UNIT TABS Take by mouth.    Marland Kitchen gemfibrozil (LOPID) 600 MG tablet Take 600 mg by mouth 2 (two) times daily before a meal.    . glucose blood (ONETOUCH VERIO) test strip Use to check blood sugar 1 time per day Dx code E11.9 100 each 2  . losartan (COZAAR) 25 MG tablet Take 25 mg by mouth daily.    Glory Rosebush DELICA LANCETS 76L MISC Use to check blood sugar 1 time per day. Dx code : E11.9 100 each 2  . vitamin E (VITAMIN E) 400 UNIT capsule Take 400 Units by mouth daily.    . vitamin E 1000 UNIT capsule Take 1,000 Units by mouth daily.     No current facility-administered medications on  file prior to visit.     No Known Allergies  Family History  Problem Relation Age of Onset  . Diabetes Mother     BP 128/68   Pulse 88   Wt 104 lb 12.8 oz (47.5 kg)   SpO2 97%   BMI 21.17 kg/m    Review of Systems She denies hypoglycemia.     Objective:   Physical Exam VITAL SIGNS:  See vs page GENERAL: no distress Pulses: dorsalis pedis intact bilat.   MSK: no deformity of the feet CV: no leg edema Skin:  no ulcer on the feet.  normal color and temp on the feet.  Neuro: sensation is intact to touch on the feet.  Ext: There is bilateral onychomycosis of the toenails.    Lab Results  Component Value Date   HGBA1C 8.1 07/10/2017       Assessment & Plan:  Type 2 DM: worse. Noncompliance with cbg recording and meds.  We discussed need to take meds as rx'ed.    Patient Instructions  check your blood sugar once a day.  vary the time of day when you check, between before the 3 meals, and at bedtime.  also check if you have symptoms of your blood  sugar being too high or too low.  please keep a record of the readings and bring it to your next appointment here (or you can bring the meter itself).  You can write it on any piece of paper.  please call us sooner if your blood sugar goes below 70, or if you have a lot of readings over 200. I have sent a prescription to your pharmacy, to reduce the glipizide to 2.5 in the morning, and none in the evening. Please continue the same other diabetes medications. I have refilled all 3.   Please come back for a follow-up appointment in 2 months.

## 2017-09-19 ENCOUNTER — Encounter: Payer: Self-pay | Admitting: Endocrinology

## 2017-09-19 ENCOUNTER — Other Ambulatory Visit: Payer: Self-pay

## 2017-09-19 ENCOUNTER — Ambulatory Visit (INDEPENDENT_AMBULATORY_CARE_PROVIDER_SITE_OTHER): Payer: Medicare Other | Admitting: Endocrinology

## 2017-09-19 VITALS — BP 130/62 | HR 75 | Wt 108.6 lb

## 2017-09-19 DIAGNOSIS — E139 Other specified diabetes mellitus without complications: Secondary | ICD-10-CM

## 2017-09-19 DIAGNOSIS — E119 Type 2 diabetes mellitus without complications: Secondary | ICD-10-CM | POA: Diagnosis not present

## 2017-09-19 LAB — POCT GLYCOSYLATED HEMOGLOBIN (HGB A1C): Hemoglobin A1C: 6.6 % — AB (ref 4.0–5.6)

## 2017-09-19 MED ORDER — PIOGLITAZONE HCL 45 MG PO TABS: 45.0000 mg | ORAL_TABLET | Freq: Every day | ORAL | 3 refills | Status: DC

## 2017-09-19 MED ORDER — METFORMIN HCL 1000 MG PO TABS: 1000.0000 mg | ORAL_TABLET | Freq: Two times a day (BID) | ORAL | 3 refills | Status: DC

## 2017-09-19 MED ORDER — SITAGLIPTIN PHOSPHATE 100 MG PO TABS: 100.0000 mg | ORAL_TABLET | Freq: Every day | ORAL | 3 refills | Status: DC

## 2017-09-19 MED ORDER — GLIMEPIRIDE 1 MG PO TABS: 0.5000 mg | ORAL_TABLET | Freq: Every day | ORAL | 5 refills | Status: DC

## 2017-09-19 MED ORDER — GLIPIZIDE ER 2.5 MG PO TB24: 2.5000 mg | ORAL_TABLET | Freq: Every day | ORAL | 3 refills | Status: DC

## 2017-09-19 NOTE — Patient Instructions (Addendum)
check your blood sugar once a day.  vary the time of day when you check, between before the 3 meals, and at bedtime.  also check if you have symptoms of your blood sugar being too high or too low.  please keep a record of the readings and bring it to your next appointment here (or you can bring the meter itself).  You can write it on any piece of paper.  please call us sooner if your blood sugar goes below 70, or if you have a lot of readings over 200. I have sent a prescription to your pharmacy, to change the glipizide to glimepiride, 1/2 pill each morning Please continue the same other diabetes medications.    Please come back for a follow-up appointment in 3-4 months.

## 2017-09-19 NOTE — Progress Notes (Signed)
Subjective:    Patient ID: Cindy Guerrero, female    DOB: 24-Oct-1950, 67 y.o.   MRN: [id number]  HPI Pt returns for f/u of diabetes mellitus: DM type: 2 Dx'ed: 3762 Complications: none Therapy: 4 oral meds GDM: never DKA: never Severe hypoglycemia: never Pancreatitis: never Other: she has never been on insulin, but she has learned how.   Interval history: She says cbg's are in the low to mid-100's.  pt states she feels well in general.   Past Medical History:  Diagnosis Date  . Diabetes ([place])   . Dyslipidemia   . Headache   . Osteoporosis     History reviewed. No pertinent surgical history.  Social History   Socioeconomic History  . Marital status: Married    Spouse name: Not on file  . Number of children: Not on file  . Years of education: Not on file  . Highest education level: Not on file  Occupational History  . Not on file  Social Needs  . Financial resource strain: Not on file  . Food insecurity:    Worry: Not on file    Inability: Not on file  . Transportation needs:    Medical: Not on file    Non-medical: Not on file  Tobacco Use  . Smoking status: Never Smoker  . Smokeless tobacco: Never Used  Substance and Sexual Activity  . Alcohol use: No  . Drug use: Not on file  . Sexual activity: Not on file  Lifestyle  . Physical activity:    Days per week: Not on file    Minutes per session: Not on file  . Stress: Not on file  Relationships  . Social connections:    Talks on phone: Not on file    Gets together: Not on file    Attends religious service: Not on file    Active member of club or organization: Not on file    Attends meetings of clubs or organizations: Not on file    Relationship status: Not on file  . Intimate partner violence:    Fear of current or ex partner: Not on file    Emotionally abused: Not on file    Physically abused: Not on file    Forced sexual activity: Not on file  Other Topics Concern  . Not on file  Social History  Narrative  . Not on file    Current Outpatient Medications on File Prior to Visit  Medication Sig Dispense Refill  . alendronate (FOSAMAX) 70 MG tablet Take 70 mg by mouth once a week. Take with a full glass of water on an empty stomach.    . Calcium Carb-Cholecalciferol 600-800 MG-UNIT TABS Take by mouth.    Marland Kitchen gemfibrozil (LOPID) 600 MG tablet Take 600 mg by mouth 2 (two) times daily before a meal.    . glucose blood (ONETOUCH VERIO) test strip Use to check blood sugar 1 time per day Dx code E11.9 100 each 2  . losartan (COZAAR) 25 MG tablet Take 25 mg by mouth daily.    Glory Rosebush DELICA LANCETS 83T MISC Use to check blood sugar 1 time per day. Dx code : E11.9 100 each 2  . vitamin E (VITAMIN E) 400 UNIT capsule Take 400 Units by mouth daily.    . vitamin E 1000 UNIT capsule Take 1,000 Units by mouth daily.     No current facility-administered medications on file prior to visit.     No Known Allergies  Family  History  Problem Relation Age of Onset  . Diabetes Mother     BP 130/62 (BP Location: Left Arm, Patient Position: Sitting, Cuff Size: Normal)   Pulse 75   Wt 108 lb 9.6 oz (49.3 kg)   SpO2 95%   BMI 21.93 kg/m    Review of Systems She denies hypoglycemia.      Objective:   Physical Exam VITAL SIGNS:  See vs page GENERAL: no distress Pulses: foot pulses are intact bilaterally.   MSK: no deformity of the feet or ankles.  CV: no edema of the legs or ankles Skin:  no ulcer on the feet or ankles.  normal color and temp on the feet and ankles Neuro: sensation is intact to touch on the feet and ankles.    Lab Results  Component Value Date   HGBA1C 6.6 (A) 09/19/2017       Assessment & Plan:  Type 2 DM: overcontrolled, for this SU-containing regimen.  Patient Instructions  check your blood sugar once a day.  vary the time of day when you check, between before the 3 meals, and at bedtime.  also check if you have symptoms of your blood sugar being too high or too  low.  please keep a record of the readings and bring it to your next appointment here (or you can bring the meter itself).  You can write it on any piece of paper.  please call us sooner if your blood sugar goes below 70, or if you have a lot of readings over 200. I have sent a prescription to your pharmacy, to change the glipizide to glimepiride, 1/2 pill each morning Please continue the same other diabetes medications.    Please come back for a follow-up appointment in 3-4 months.

## 2017-11-20 DIAGNOSIS — E1129 Type 2 diabetes mellitus with other diabetic kidney complication: Secondary | ICD-10-CM | POA: Diagnosis not present

## 2017-11-20 DIAGNOSIS — M810 Age-related osteoporosis without current pathological fracture: Secondary | ICD-10-CM | POA: Diagnosis not present

## 2017-11-20 DIAGNOSIS — Z1159 Encounter for screening for other viral diseases: Secondary | ICD-10-CM | POA: Diagnosis not present

## 2017-11-20 DIAGNOSIS — K5790 Diverticulosis of intestine, part unspecified, without perforation or abscess without bleeding: Secondary | ICD-10-CM | POA: Diagnosis not present

## 2017-11-20 DIAGNOSIS — Z0000 Encounter for general adult medical examination without abnormal findings: Secondary | ICD-10-CM | POA: Diagnosis not present

## 2017-11-20 DIAGNOSIS — E782 Mixed hyperlipidemia: Secondary | ICD-10-CM | POA: Diagnosis not present

## 2017-11-20 DIAGNOSIS — Z23 Encounter for immunization: Secondary | ICD-10-CM | POA: Diagnosis not present

## 2017-11-20 DIAGNOSIS — E1165 Type 2 diabetes mellitus with hyperglycemia: Secondary | ICD-10-CM | POA: Diagnosis not present

## 2017-11-20 DIAGNOSIS — M79604 Pain in right leg: Secondary | ICD-10-CM | POA: Diagnosis not present

## 2017-11-20 DIAGNOSIS — R079 Chest pain, unspecified: Secondary | ICD-10-CM | POA: Diagnosis not present

## 2017-11-20 DIAGNOSIS — R809 Proteinuria, unspecified: Secondary | ICD-10-CM | POA: Diagnosis not present

## 2017-11-25 ENCOUNTER — Other Ambulatory Visit: Payer: Self-pay | Admitting: Family Medicine

## 2017-11-25 DIAGNOSIS — Z1231 Encounter for screening mammogram for malignant neoplasm of breast: Secondary | ICD-10-CM

## 2018-01-08 ENCOUNTER — Ambulatory Visit (INDEPENDENT_AMBULATORY_CARE_PROVIDER_SITE_OTHER): Payer: Medicare Other | Admitting: Endocrinology

## 2018-01-08 ENCOUNTER — Encounter: Payer: Self-pay | Admitting: Endocrinology

## 2018-01-08 VITALS — BP 140/70 | HR 91 | Ht 59.0 in | Wt 109.0 lb

## 2018-01-08 DIAGNOSIS — E139 Other specified diabetes mellitus without complications: Secondary | ICD-10-CM | POA: Diagnosis not present

## 2018-01-08 DIAGNOSIS — Z23 Encounter for immunization: Secondary | ICD-10-CM | POA: Diagnosis not present

## 2018-01-08 LAB — POCT GLYCOSYLATED HEMOGLOBIN (HGB A1C): Hemoglobin A1C: 6.4 % — AB (ref 4.0–5.6)

## 2018-01-08 MED ORDER — PIOGLITAZONE HCL 45 MG PO TABS: 45.0000 mg | ORAL_TABLET | Freq: Every day | ORAL | 3 refills | Status: DC

## 2018-01-08 NOTE — Patient Instructions (Addendum)
check your blood sugar once a day.  vary the time of day when you check, between before the 3 meals, and at bedtime.  also check if you have symptoms of your blood sugar being too high or too low.  please keep a record of the readings and bring it to your next appointment here (or you can bring the meter itself).  You can write it on any piece of paper.  please call us sooner if your blood sugar goes below 70, or if you have a lot of readings over 200. Please stop taking the glimepiride. Please continue the same other diabetes medications.  Please come back for a follow-up appointment in 3-4 months.    ki?m tra xem b?n c tri?u ch?ng no v? l??ng ???ng trong mu qu cao hay qu th?p khng. vui lng gi? m?t b?n ghi cc bi ??c v mang n ??n cu?c h?n ti?p theo c?a b?n ? ?y (ho?c b?n c th? t? mang ??ng h?). B?n c th? vi?t n trn b?t k? m?nh gi?y. vui lng g?i cho chng ti s?m h?n n?u l??ng ???ng trong mu c?a b?n d??i 70, ho?c n?u b?n c nhi?u bi ??c h?n 200. Hy ng?ng dng glimepiride. Vui lng ti?p t?c cc lo?i thu?c tr? ti?u ???ng khc. Vui lng quay l?i ?? h?n ti khm sau 3-4 thng.

## 2018-01-08 NOTE — Progress Notes (Signed)
Subjective:    Patient ID: Cindy Guerrero, female    DOB: [birth year], 67 y.o.   MRN: [id number]  HPI Pt returns for f/u of diabetes mellitus: DM type: 2 Dx'ed: 7062 Complications: none Therapy: 4 oral meds GDM: never DKA: never Severe hypoglycemia: never Pancreatitis: never Other: she has never been on insulin, but she has learned how.   Interval history: She says cbg's are in the low to mid-100's.  pt states she feels well in general, except for mild hypoglycemia approx once per day Past Medical History:  Diagnosis Date  . Diabetes ([place])   . Dyslipidemia   . Headache   . Osteoporosis     No past surgical history on file.  Social History   Socioeconomic History  . Marital status: Married    Spouse name: Not on file  . Number of children: Not on file  . Years of education: Not on file  . Highest education level: Not on file  Occupational History  . Not on file  Social Needs  . Financial resource strain: Not on file  . Food insecurity:    Worry: Not on file    Inability: Not on file  . Transportation needs:    Medical: Not on file    Non-medical: Not on file  Tobacco Use  . Smoking status: Never Smoker  . Smokeless tobacco: Never Used  Substance and Sexual Activity  . Alcohol use: No  . Drug use: Not on file  . Sexual activity: Not on file  Lifestyle  . Physical activity:    Days per week: Not on file    Minutes per session: Not on file  . Stress: Not on file  Relationships  . Social connections:    Talks on phone: Not on file    Gets together: Not on file    Attends religious service: Not on file    Active member of club or organization: Not on file    Attends meetings of clubs or organizations: Not on file    Relationship status: Not on file  . Intimate partner violence:    Fear of current or ex partner: Not on file    Emotionally abused: Not on file    Physically abused: Not on file    Forced sexual activity: Not on file  Other Topics Concern  . Not  on file  Social History Narrative  . Not on file    Current Outpatient Medications on File Prior to Visit  Medication Sig Dispense Refill  . alendronate (FOSAMAX) 70 MG tablet Take 70 mg by mouth once a week. Take with a full glass of water on an empty stomach.    . Calcium Carb-Cholecalciferol 600-800 MG-UNIT TABS Take by mouth.    Marland Kitchen gemfibrozil (LOPID) 600 MG tablet Take 600 mg by mouth 2 (two) times daily before a meal.    . glucose blood (ONETOUCH VERIO) test strip Use to check blood sugar 1 time per day Dx code E11.9 100 each 2  . losartan (COZAAR) 25 MG tablet Take 25 mg by mouth daily.    . metFORMIN (GLUCOPHAGE) 1000 MG tablet Take 1 tablet (1,000 mg total) by mouth 2 (two) times daily with a meal. 180 tablet 3  . ONETOUCH DELICA LANCETS 37S MISC Use to check blood sugar 1 time per day. Dx code : E11.9 100 each 2  . sitaGLIPtin (JANUVIA) 100 MG tablet Take 1 tablet (100 mg total) by mouth daily. 90 tablet 3  .  vitamin E (VITAMIN E) 400 UNIT capsule Take 400 Units by mouth daily.    . vitamin E 1000 UNIT capsule Take 1,000 Units by mouth daily.     No current facility-administered medications on file prior to visit.     No Known Allergies  Family History  Problem Relation Age of Onset  . Diabetes Mother     BP 140/70 (BP Location: Left Arm, Patient Position: Sitting, Cuff Size: Normal)   Pulse 91   Ht 4\' 11"  (1.499 m)   Wt 109 lb (49.4 kg)   SpO2 96%   BMI 22.02 kg/m    Review of Systems Denies LOC    Objective:   Physical Exam VITAL SIGNS:  See vs page GENERAL: no distress Pulses: foot pulses are intact bilaterally.   MSK: no deformity of the feet or ankles.  CV: no edema of the legs or ankles Skin:  no ulcer on the feet or ankles.  normal color and temp on the feet and ankles Neuro: sensation is intact to touch on the feet and ankles.     Lab Results  Component Value Date   HGBA1C 6.4 (A) 01/08/2018       Assessment & Plan:  Type 2 DM:  overcontrolled, for this SU-containing regimen.   Patient Instructions  check your blood sugar once a day.  vary the time of day when you check, between before the 3 meals, and at bedtime.  also check if you have symptoms of your blood sugar being too high or too low.  please keep a record of the readings and bring it to your next appointment here (or you can bring the meter itself).  You can write it on any piece of paper.  please call us sooner if your blood sugar goes below 70, or if you have a lot of readings over 200. Please stop taking the glimepiride. Please continue the same other diabetes medications.  Please come back for a follow-up appointment in 3-4 months.    ki?m tra xem b?n c tri?u ch?ng no v? l??ng ???ng trong mu qu cao hay qu th?p khng. vui lng gi? m?t b?n ghi cc bi ??c v mang n ??n cu?c h?n ti?p theo c?a b?n ? ?y (ho?c b?n c th? t? mang ??ng h?). B?n c th? vi?t n trn b?t k? m?nh gi?y. vui lng g?i cho chng ti s?m h?n n?u l??ng ???ng trong mu c?a b?n d??i 70, ho?c n?u b?n c nhi?u bi ??c h?n 200. Hy ng?ng dng glimepiride. Vui lng ti?p t?c cc lo?i thu?c tr? ti?u ???ng khc. Vui lng quay l?i ?? h?n ti khm sau 3-4 thng.

## 2018-01-28 ENCOUNTER — Other Ambulatory Visit: Payer: Self-pay | Admitting: Family Medicine

## 2018-02-02 ENCOUNTER — Other Ambulatory Visit: Payer: Self-pay | Admitting: Family Medicine

## 2018-02-02 DIAGNOSIS — M810 Age-related osteoporosis without current pathological fracture: Secondary | ICD-10-CM

## 2018-05-26 ENCOUNTER — Encounter: Payer: Self-pay | Admitting: Endocrinology

## 2018-05-26 ENCOUNTER — Other Ambulatory Visit: Payer: Self-pay

## 2018-05-26 ENCOUNTER — Ambulatory Visit (INDEPENDENT_AMBULATORY_CARE_PROVIDER_SITE_OTHER): Payer: Medicare Other | Admitting: Endocrinology

## 2018-05-26 VITALS — BP 160/72 | HR 74 | Temp 98.0°F | Resp 14

## 2018-05-26 DIAGNOSIS — E139 Other specified diabetes mellitus without complications: Secondary | ICD-10-CM

## 2018-05-26 NOTE — Patient Instructions (Signed)
check your blood sugar once a day.  vary the time of day when you check, between before the 3 meals, and at bedtime.  also check if you have symptoms of your blood sugar being too high or too low.  please keep a record of the readings and bring it to your next appointment here (or you can bring the meter itself).  You can write it on any piece of paper.  please call us sooner if your blood sugar goes below 70, or if you have a lot of readings over 200. Please continue the same diabetes medications.  Please come back for a follow-up appointment in 3-4 months.   ki?m tra l??ng ???ng trong mu m?i ngy m?t l?n. thay ??i th?i gian trong ngy khi b?n ki?m tra, gi?a tr??c 3 b?a ?n v tr??c khi ?i ng?. ??ng th?i ki?m tra xem b?n c tri?u ch?ng no v? l??ng ???ng trong mu qu cao hay qu th?p khng. vui lng gi? m?t b?n ghi cc bi ??c v mang n ??n cu?c h?n ti?p theo c?a b?n ? ?y (ho?c b?n c th? t? mang ??ng h?). B?n c th? vi?t n trn b?t k? m?nh gi?y. vui lng g?i cho chng ti s?m h?n n?u l??ng ???ng trong mu c?a b?n d??i 70, ho?c n?u b?n c nhi?u bi ??c h?n 200. Hy ti?p t?c cc lo?i thu?c tr? ti?u ???ng. Vui lng quay l?i ?? h?n ti khm sau 3-4 thng.

## 2018-05-26 NOTE — Progress Notes (Signed)
Subjective:    Patient ID: Cindy Guerrero, female    DOB: [birth year], 68 y.o.   MRN: [id number]  HPI Pt returns for f/u of diabetes mellitus: DM type: 2 Dx'ed: 3614 Complications: none Therapy: 4 oral meds GDM: never DKA: never Severe hypoglycemia: never Pancreatitis: never Other: she has never been on insulin, but she has learned how.   Interval history: She says cbg's are in the low to mid-100's.  pt states she feels well in general.  She was checked in as though she had been expsed to coronavirus, but pt says no.  Past Medical History:  Diagnosis Date  . Diabetes ([place])   . Dyslipidemia   . Headache   . Osteoporosis     No past surgical history on file.  Social History   Socioeconomic History  . Marital status: Married    Spouse name: Not on file  . Number of children: Not on file  . Years of education: Not on file  . Highest education level: Not on file  Occupational History  . Not on file  Social Needs  . Financial resource strain: Not on file  . Food insecurity:    Worry: Not on file    Inability: Not on file  . Transportation needs:    Medical: Not on file    Non-medical: Not on file  Tobacco Use  . Smoking status: Never Smoker  . Smokeless tobacco: Never Used  Substance and Sexual Activity  . Alcohol use: No  . Drug use: Not on file  . Sexual activity: Not on file  Lifestyle  . Physical activity:    Days per week: Not on file    Minutes per session: Not on file  . Stress: Not on file  Relationships  . Social connections:    Talks on phone: Not on file    Gets together: Not on file    Attends religious service: Not on file    Active member of club or organization: Not on file    Attends meetings of clubs or organizations: Not on file    Relationship status: Not on file  . Intimate partner violence:    Fear of current or ex partner: Not on file    Emotionally abused: Not on file    Physically abused: Not on file    Forced sexual activity: Not on  file  Other Topics Concern  . Not on file  Social History Narrative  . Not on file    Current Outpatient Medications on File Prior to Visit  Medication Sig Dispense Refill  . alendronate (FOSAMAX) 70 MG tablet Take 70 mg by mouth once a week. Take with a full glass of water on an empty stomach.    . Calcium Carb-Cholecalciferol 600-800 MG-UNIT TABS Take by mouth.    Marland Kitchen gemfibrozil (LOPID) 600 MG tablet Take 600 mg by mouth 2 (two) times daily before a meal.    . glucose blood (ONETOUCH VERIO) test strip Use to check blood sugar 1 time per day Dx code E11.9 100 each 2  . losartan (COZAAR) 25 MG tablet Take 25 mg by mouth daily.    . metFORMIN (GLUCOPHAGE) 1000 MG tablet Take 1 tablet (1,000 mg total) by mouth 2 (two) times daily with a meal. 180 tablet 3  . ONETOUCH DELICA LANCETS 43X MISC Use to check blood sugar 1 time per day. Dx code : E11.9 100 each 2  . pioglitazone (ACTOS) 45 MG tablet Take 1 tablet (45  mg total) by mouth daily. 90 tablet 3  . sitaGLIPtin (JANUVIA) 100 MG tablet Take 1 tablet (100 mg total) by mouth daily. 90 tablet 3  . vitamin E (VITAMIN E) 400 UNIT capsule Take 400 Units by mouth daily.    . vitamin E 1000 UNIT capsule Take 1,000 Units by mouth daily.     No current facility-administered medications on file prior to visit.     No Known Allergies  Family History  Problem Relation Age of Onset  . Diabetes Mother     BP (!) 160/72   Pulse 74   Temp 98 F (36.7 C)   Resp 14   SpO2 98%    Review of Systems She denies hypoglycemia.      Objective:   Physical Exam VITAL SIGNS:  See vs page GENERAL: no distress Pulses: dorsalis pedis intact bilat.   MSK: no deformity of the feet CV: no leg edema Skin:  no ulcer on the feet.  normal color and temp on the feet. Neuro: sensation is intact to touch on the feet  A1c=6.3%     Assessment & Plan:  Type 2 DM: well-controlled HTN: recheck next time.    Patient Instructions  check your blood sugar  once a day.  vary the time of day when you check, between before the 3 meals, and at bedtime.  also check if you have symptoms of your blood sugar being too high or too low.  please keep a record of the readings and bring it to your next appointment here (or you can bring the meter itself).  You can write it on any piece of paper.  please call us sooner if your blood sugar goes below 70, or if you have a lot of readings over 200. Please continue the same diabetes medications.  Please come back for a follow-up appointment in 3-4 months.   ki?m tra l??ng ???ng trong mu m?i ngy m?t l?n. thay ??i th?i gian trong ngy khi b?n ki?m tra, gi?a tr??c 3 b?a ?n v tr??c khi ?i ng?. ??ng th?i ki?m tra xem b?n c tri?u ch?ng no v? l??ng ???ng trong mu qu cao hay qu th?p khng. vui lng gi? m?t b?n ghi cc bi ??c v mang n ??n cu?c h?n ti?p theo c?a b?n ? ?y (ho?c b?n c th? t? mang ??ng h?). B?n c th? vi?t n trn b?t k? m?nh gi?y. vui lng g?i cho chng ti s?m h?n n?u l??ng ???ng trong mu c?a b?n d??i 70, ho?c n?u b?n c nhi?u bi ??c h?n 200. Hy ti?p t?c cc lo?i thu?c tr? ti?u ???ng. Vui lng quay l?i ?? h?n ti khm sau 3-4 thng.

## 2018-05-28 ENCOUNTER — Other Ambulatory Visit: Payer: Self-pay

## 2018-05-28 MED ORDER — GLUCOSE BLOOD VI STRP: ORAL_STRIP | 2 refills | Status: DC

## 2018-05-28 MED ORDER — METFORMIN HCL 1000 MG PO TABS: 1000.0000 mg | ORAL_TABLET | Freq: Two times a day (BID) | ORAL | 3 refills | Status: DC

## 2018-05-28 MED ORDER — PIOGLITAZONE HCL 45 MG PO TABS: 45.0000 mg | ORAL_TABLET | Freq: Every day | ORAL | 3 refills | Status: DC

## 2018-05-28 MED ORDER — ONETOUCH DELICA LANCETS 33G MISC: 2 refills | Status: DC

## 2018-05-28 MED ORDER — SITAGLIPTIN PHOSPHATE 100 MG PO TABS: 100.0000 mg | ORAL_TABLET | Freq: Every day | ORAL | 3 refills | Status: DC

## 2018-09-25 ENCOUNTER — Other Ambulatory Visit: Payer: Self-pay

## 2018-09-25 ENCOUNTER — Ambulatory Visit (INDEPENDENT_AMBULATORY_CARE_PROVIDER_SITE_OTHER): Payer: Medicare Other | Admitting: Endocrinology

## 2018-09-25 ENCOUNTER — Encounter: Payer: Self-pay | Admitting: Endocrinology

## 2018-09-25 VITALS — BP 142/70 | HR 79 | Ht 59.0 in | Wt 106.8 lb

## 2018-09-25 DIAGNOSIS — E139 Other specified diabetes mellitus without complications: Secondary | ICD-10-CM | POA: Diagnosis not present

## 2018-09-25 LAB — POCT GLYCOSYLATED HEMOGLOBIN (HGB A1C): Hemoglobin A1C: 6.4 % — AB (ref 4.0–5.6)

## 2018-09-25 MED ORDER — REPAGLINIDE 0.5 MG PO TABS: 0.5000 mg | ORAL_TABLET | Freq: Three times a day (TID) | ORAL | 3 refills | Status: DC

## 2018-09-25 NOTE — Patient Instructions (Addendum)
check your blood sugar once a day.  vary the time of day when you check, between before the 3 meals, and at bedtime.  also check if you have symptoms of your blood sugar being too high or too low.  please keep a record of the readings and bring it to your next appointment here (or you can bring the meter itself).  You can write it on any piece of paper.  please call us sooner if your blood sugar goes below 70, or if you have a lot of readings over 200. I have sent a prescription to your pharmacy, for "repaglinide."  This replaced the glipizide Please continue the same other diabetes medications.  Your blood pressure is high today.  Please see your primary care provider soon, to have it rechecked.  Here is another paper, with her phone number Please come back for a follow-up appointment in 3 months.    ki?m tra l??ng ???ng trong mu m?i ngy m?t l?n. thay ??i th?i gian trong ngy khi b?n ki?m tra, gi?a tr??c 3 b?a ?n v tr??c khi ?i ng?. ??ng th?i ki?m tra xem b?n c tri?u ch?ng no v? l??ng ???ng trong mu qu cao hay qu th?p khng. vui lng gi? m?t b?n ghi cc bi ??c v mang n ??n cu?c h?n ti?p theo c?a b?n ? ?y (ho?c b?n c th? t? mang ??ng h?). B?n c th? vi?t n trn b?t k? m?nh gi?y. vui lng g?i cho chng ti s?m h?n n?u l??ng ???ng trong mu c?a b?n d??i 70, ho?c n?u b?n c nhi?u bi ??c h?n 200. Ti ? g?i m?t ??n thu?c ??n nh thu?c c?a b?n, cho "repaglinide." ?i?u ny ? thay th? glipizide Vui lng ti?p t?c cc lo?i thu?c tr? ti?u ???ng khc. Huy?t p c?a b?n hm nay cao Vui lng g?p nh cung c?p d?ch v? ch?m Derry chnh c?a b?n s?m ?? ???c ki?m tra l?i. ?y l m?t t? gi?y khc, v?i s? ?i?n tho?i c?a c ?y Vui lng quay l?i ?? h?n ti khm sau 3 thng.

## 2018-09-25 NOTE — Progress Notes (Signed)
Subjective:    Patient ID: Cindy Guerrero, female    DOB: Dec 24, 1950, 68 y.o.   MRN: [id number]  HPI Pt returns for f/u of diabetes mellitus: DM type: 2 Dx'ed: 0254 Complications: none Therapy: 3 oral meds GDM: never DKA: never Severe hypoglycemia: never Pancreatitis: never Other: she has never been on insulin, but she has learned how.   Interval history: She says cbg's vary from 105-246.  It is in general higher as the day goes on.  She takes glipizide PRN (approx 3/week), if cbg is high.  pt states she feels well in general.   Past Medical History:  Diagnosis Date  . Diabetes ([place])   . Dyslipidemia   . Headache   . Osteoporosis     No past surgical history on file.  Social History   Socioeconomic History  . Marital status: Married    Spouse name: Not on file  . Number of children: Not on file  . Years of education: Not on file  . Highest education level: Not on file  Occupational History  . Not on file  Social Needs  . Financial resource strain: Not on file  . Food insecurity    Worry: Not on file    Inability: Not on file  . Transportation needs    Medical: Not on file    Non-medical: Not on file  Tobacco Use  . Smoking status: Never Smoker  . Smokeless tobacco: Never Used  Substance and Sexual Activity  . Alcohol use: No  . Drug use: Not on file  . Sexual activity: Not on file  Lifestyle  . Physical activity    Days per week: Not on file    Minutes per session: Not on file  . Stress: Not on file  Relationships  . Social Herbalist on phone: Not on file    Gets together: Not on file    Attends religious service: Not on file    Active member of club or organization: Not on file    Attends meetings of clubs or organizations: Not on file    Relationship status: Not on file  . Intimate partner violence    Fear of current or ex partner: Not on file    Emotionally abused: Not on file    Physically abused: Not on file    Forced sexual activity:  Not on file  Other Topics Concern  . Not on file  Social History Narrative  . Not on file    Current Outpatient Medications on File Prior to Visit  Medication Sig Dispense Refill  . alendronate (FOSAMAX) 70 MG tablet Take 70 mg by mouth once a week. Take with a full glass of water on an empty stomach.    . Calcium Carb-Cholecalciferol 600-800 MG-UNIT TABS Take by mouth.    Marland Kitchen gemfibrozil (LOPID) 600 MG tablet Take 600 mg by mouth 2 (two) times daily before a meal.    . glucose blood (ONETOUCH VERIO) test strip Use to check blood sugar 1 time per day Dx code E11.9 100 each 2  . losartan (COZAAR) 25 MG tablet Take 25 mg by mouth daily.    . metFORMIN (GLUCOPHAGE) 1000 MG tablet Take 1 tablet (1,000 mg total) by mouth 2 (two) times daily with a meal. 180 tablet 3  . OneTouch Delica Lancets 27C MISC Use to check blood sugar 1 time per day. Dx code : E11.9 100 each 2  . pioglitazone (ACTOS) 45 MG tablet Take  1 tablet (45 mg total) by mouth daily. 90 tablet 3  . sitaGLIPtin (JANUVIA) 100 MG tablet Take 1 tablet (100 mg total) by mouth daily. 90 tablet 3  . vitamin E (VITAMIN E) 400 UNIT capsule Take 400 Units by mouth daily.    . vitamin E 1000 UNIT capsule Take 1,000 Units by mouth daily.     No current facility-administered medications on file prior to visit.     No Known Allergies  Family History  Problem Relation Age of Onset  . Diabetes Mother     BP (!) 142/70 (BP Location: Right Arm, Patient Position: Sitting, Cuff Size: Normal)   Pulse 79   Ht 4\' 11"  (1.499 m)   Wt 106 lb 12.8 oz (48.4 kg)   SpO2 97%   BMI 21.57 kg/m    Review of Systems She denies hypoglycemia.      Objective:   Physical Exam VITAL SIGNS:  See vs page GENERAL: no distress Pulses: dorsalis pedis intact bilat.   MSK: no deformity of the feet CV: no leg edema Skin:  no ulcer on the feet.  normal color and temp on the feet. Neuro: sensation is intact to touch on the feet.  Ext: both great toenails  are ingrown, but no swell/drainage/erythema  Lab Results  Component Value Date   HGBA1C 6.4 (A) 09/25/2018       Assessment & Plan:  HTN: is noted today Type 2 DM: overcontrolled, for this SU-containing regimen.   Patient Instructions  check your blood sugar once a day.  vary the time of day when you check, between before the 3 meals, and at bedtime.  also check if you have symptoms of your blood sugar being too high or too low.  please keep a record of the readings and bring it to your next appointment here (or you can bring the meter itself).  You can write it on any piece of paper.  please call us sooner if your blood sugar goes below 70, or if you have a lot of readings over 200. I have sent a prescription to your pharmacy, for "repaglinide."  This replaced the glipizide Please continue the same other diabetes medications.  Your blood pressure is high today.  Please see your primary care provider soon, to have it rechecked.  Here is another paper, with her phone number Please come back for a follow-up appointment in 3 months.    ki?m tra l??ng ???ng trong mu m?i ngy m?t l?n. thay ??i th?i gian trong ngy khi b?n ki?m tra, gi?a tr??c 3 b?a ?n v tr??c khi ?i ng?. ??ng th?i ki?m tra xem b?n c tri?u ch?ng no v? l??ng ???ng trong mu qu cao hay qu th?p khng. vui lng gi? m?t b?n ghi cc bi ??c v mang n ??n cu?c h?n ti?p theo c?a b?n ? ?y (ho?c b?n c th? t? mang ??ng h?). B?n c th? vi?t n trn b?t k? m?nh gi?y. vui lng g?i cho chng ti s?m h?n n?u l??ng ???ng trong mu c?a b?n d??i 70, ho?c n?u b?n c nhi?u bi ??c h?n 200. Ti ? g?i m?t ??n thu?c ??n nh thu?c c?a b?n, cho "repaglinide." ?i?u ny ? thay th? glipizide Vui lng ti?p t?c cc lo?i thu?c tr? ti?u ???ng khc. Huy?t p c?a b?n hm nay cao Vui lng g?p nh cung c?p d?ch v? ch?m [place] chnh c?a b?n s?m ?? ???c ki?m tra l?i. ?y l m?t t? gi?y khc, v?i s? ?i?n tho?i  c?a c ?y Vui lng quay l?i ?? h?n ti khm sau 3  thng.

## 2018-12-28 ENCOUNTER — Ambulatory Visit: Payer: Medicare Other | Admitting: Endocrinology

## 2019-01-11 ENCOUNTER — Other Ambulatory Visit: Payer: Self-pay

## 2019-01-13 ENCOUNTER — Encounter: Payer: Self-pay | Admitting: Endocrinology

## 2019-01-13 ENCOUNTER — Other Ambulatory Visit: Payer: Self-pay

## 2019-01-13 ENCOUNTER — Ambulatory Visit (INDEPENDENT_AMBULATORY_CARE_PROVIDER_SITE_OTHER): Payer: Medicare Other | Admitting: Endocrinology

## 2019-01-13 VITALS — BP 140/70 | HR 86 | Ht 59.0 in | Wt 104.8 lb

## 2019-01-13 DIAGNOSIS — E781 Pure hyperglyceridemia: Secondary | ICD-10-CM

## 2019-01-13 DIAGNOSIS — E119 Type 2 diabetes mellitus without complications: Secondary | ICD-10-CM | POA: Diagnosis not present

## 2019-01-13 DIAGNOSIS — Z23 Encounter for immunization: Secondary | ICD-10-CM

## 2019-01-13 DIAGNOSIS — E139 Other specified diabetes mellitus without complications: Secondary | ICD-10-CM

## 2019-01-13 LAB — POCT GLYCOSYLATED HEMOGLOBIN (HGB A1C): Hemoglobin A1C: 6.2 % — AB (ref 4.0–5.6)

## 2019-01-13 MED ORDER — REPAGLINIDE 0.5 MG PO TABS: 0.5000 mg | ORAL_TABLET | Freq: Three times a day (TID) | ORAL | 3 refills | Status: DC

## 2019-01-13 MED ORDER — METFORMIN HCL 1000 MG PO TABS: 1000.0000 mg | ORAL_TABLET | Freq: Two times a day (BID) | ORAL | 3 refills | Status: DC

## 2019-01-13 MED ORDER — PIOGLITAZONE HCL 45 MG PO TABS: 45.0000 mg | ORAL_TABLET | Freq: Every day | ORAL | 3 refills | Status: DC

## 2019-01-13 MED ORDER — SITAGLIPTIN PHOSPHATE 100 MG PO TABS: 100.0000 mg | ORAL_TABLET | Freq: Every day | ORAL | 3 refills | Status: DC

## 2019-01-13 NOTE — Progress Notes (Signed)
Subjective:    Patient ID: Cindy Guerrero, female    DOB: [birth year], 68 y.o.   MRN: [id number]  HPI Pt returns for f/u of diabetes mellitus: DM type: 2 Dx'ed: [id number] Complications: none Therapy: 4 oral meds GDM: never DKA: never Severe hypoglycemia: never Pancreatitis: never Other: she has never been on insulin, but she has learned how.   Interval history: She says cbg's are well-controlled.  pt states she feels well in general.  She takes meds as rx'ed Past Medical History:  Diagnosis Date  . Diabetes ([place])   . Dyslipidemia   . Headache   . Osteoporosis     No past surgical history on file.  Social History   Socioeconomic History  . Marital status: Married    Spouse name: Not on file  . Number of children: Not on file  . Years of education: Not on file  . Highest education level: Not on file  Occupational History  . Not on file  Social Needs  . Financial resource strain: Not on file  . Food insecurity    Worry: Not on file    Inability: Not on file  . Transportation needs    Medical: Not on file    Non-medical: Not on file  Tobacco Use  . Smoking status: Never Smoker  . Smokeless tobacco: Never Used  Substance and Sexual Activity  . Alcohol use: No  . Drug use: Not on file  . Sexual activity: Not on file  Lifestyle  . Physical activity    Days per week: Not on file    Minutes per session: Not on file  . Stress: Not on file  Relationships  . Social Herbalist on phone: Not on file    Gets together: Not on file    Attends religious service: Not on file    Active member of club or organization: Not on file    Attends meetings of clubs or organizations: Not on file    Relationship status: Not on file  . Intimate partner violence    Fear of current or ex partner: Not on file    Emotionally abused: Not on file    Physically abused: Not on file    Forced sexual activity: Not on file  Other Topics Concern  . Not on file  Social History Narrative   . Not on file    Current Outpatient Medications on File Prior to Visit  Medication Sig Dispense Refill  . alendronate (FOSAMAX) 70 MG tablet Take 70 mg by mouth once a week. Take with a full glass of water on an empty stomach.    . Calcium Carb-Cholecalciferol 600-800 MG-UNIT TABS Take by mouth.    Marland Kitchen gemfibrozil (LOPID) 600 MG tablet Take 600 mg by mouth 2 (two) times daily before a meal.    . glucose blood (ONETOUCH VERIO) test strip Use to check blood sugar 1 time per day Dx code E11.9 100 each 2  . losartan (COZAAR) 25 MG tablet Take 25 mg by mouth daily.    Glory Rosebush Delica Lancets 99991111 MISC Use to check blood sugar 1 time per day. Dx code : E11.9 100 each 2  . vitamin E 1000 UNIT capsule Take 1,000 Units by mouth daily.     No current facility-administered medications on file prior to visit.     No Known Allergies  Family History  Problem Relation Age of Onset  . Diabetes Mother     BP 140/70 (  BP Location: Right Arm, Patient Position: Sitting, Cuff Size: Normal)   Pulse 86   Ht 4\' 11"  (1.499 m)   Wt 104 lb 12.8 oz (47.5 kg)   SpO2 97%   BMI 21.17 kg/m    Review of Systems She denies hypoglycemia.     Objective:   Physical Exam VITAL SIGNS:  See vs page GENERAL: no distress Pulses: dorsalis pedis intact bilat.   MSK: no deformity of the feet.  CV: no leg edema.   Skin:  no ulcer on the feet.  normal color and temp on the feet.   Neuro: sensation is intact to touch on the feet.   A1c=6.2%     Assessment & Plan:  Type 2 DM: well-controlled.  Hypertriglyceridemia: there is an interaction between repaglinide and gemfibrozil, so we'll keep repaglinide dosage low   Patient Instructions  Please continue the same medications. check your blood sugar once a day.  vary the time of day when you check, between before the 3 meals, and at bedtime.  also check if you have symptoms of your blood sugar being too high or too low.  please keep a record of the readings and  bring it to your next appointment here (or you can bring the meter itself).  You can write it on any piece of paper.  please call us sooner if your blood sugar goes below 70, or if you have a lot of readings over 200. Please come back for a follow-up appointment in 4 months.     Hy ti?p t?c cc lo?i thu?c t??ng t?. ki?m tra l??ng ???ng trong mu m?i ngy m?t l?n. thay ??i th?i gian trong ngy khi b?n ki?m tra, gi?a tr??c 3 b?a ?n v tr??c khi ?i ng?. c?ng ki?m tra xem b?n c cc tri?u ch?ng v? l??ng ???ng trong mu c?a b?n qu cao ho?c qu th?p. vui lng ghi l?i cc k?t qu? ?o v mang ??n cu?c h?n ti?p theo c?a b?n t?i ?y (ho?c b?n c th? t? mang my ?o). B?n c th? vi?t n trn b?t k? m?nh gi?y no. Vui lng g?i cho chng ti s?m h?n n?u l??ng ???ng trong mu c?a b?n xu?ng d??i 70, ho?c n?u b?n c nhi?u ch? s? trn 200. Vui lng quay l?i ti khm sau 4 thng.

## 2019-01-13 NOTE — Patient Instructions (Addendum)
Please continue the same medications. check your blood sugar once a day.  vary the time of day when you check, between before the 3 meals, and at bedtime.  also check if you have symptoms of your blood sugar being too high or too low.  please keep a record of the readings and bring it to your next appointment here (or you can bring the meter itself).  You can write it on any piece of paper.  please call us sooner if your blood sugar goes below 70, or if you have a lot of readings over 200. Please come back for a follow-up appointment in 4 months.     Hy ti?p t?c cc lo?i thu?c t??ng t?. ki?m tra l??ng ???ng trong mu m?i ngy m?t l?n. thay ??i th?i gian trong ngy khi b?n ki?m tra, gi?a tr??c 3 b?a ?n v tr??c khi ?i ng?. c?ng ki?m tra xem b?n c cc tri?u ch?ng v? l??ng ???ng trong mu c?a b?n qu cao ho?c qu th?p. vui lng ghi l?i cc k?t qu? ?o v mang ??n cu?c h?n ti?p theo c?a b?n t?i ?y (ho?c b?n c th? t? mang my ?o). B?n c th? vi?t n trn b?t k? m?nh gi?y no. Vui lng g?i cho chng ti s?m h?n n?u l??ng ???ng trong mu c?a b?n xu?ng d??i 70, ho?c n?u b?n c nhi?u ch? s? trn 200. Vui lng quay l?i ti khm sau 4 thng.

## 2019-05-11 DIAGNOSIS — R809 Proteinuria, unspecified: Secondary | ICD-10-CM | POA: Diagnosis not present

## 2019-05-11 DIAGNOSIS — E782 Mixed hyperlipidemia: Secondary | ICD-10-CM | POA: Diagnosis not present

## 2019-05-11 DIAGNOSIS — M810 Age-related osteoporosis without current pathological fracture: Secondary | ICD-10-CM | POA: Diagnosis not present

## 2019-05-11 DIAGNOSIS — E1165 Type 2 diabetes mellitus with hyperglycemia: Secondary | ICD-10-CM | POA: Diagnosis not present

## 2019-05-11 DIAGNOSIS — Z0000 Encounter for general adult medical examination without abnormal findings: Secondary | ICD-10-CM | POA: Diagnosis not present

## 2019-05-11 DIAGNOSIS — E1129 Type 2 diabetes mellitus with other diabetic kidney complication: Secondary | ICD-10-CM | POA: Diagnosis not present

## 2019-05-12 ENCOUNTER — Other Ambulatory Visit: Payer: Self-pay | Admitting: Family Medicine

## 2019-05-12 DIAGNOSIS — M810 Age-related osteoporosis without current pathological fracture: Secondary | ICD-10-CM

## 2019-05-12 DIAGNOSIS — E2839 Other primary ovarian failure: Secondary | ICD-10-CM

## 2019-05-12 DIAGNOSIS — Z1231 Encounter for screening mammogram for malignant neoplasm of breast: Secondary | ICD-10-CM

## 2019-05-13 ENCOUNTER — Ambulatory Visit (INDEPENDENT_AMBULATORY_CARE_PROVIDER_SITE_OTHER): Payer: Medicare Other | Admitting: Endocrinology

## 2019-05-13 ENCOUNTER — Encounter: Payer: Self-pay | Admitting: Endocrinology

## 2019-05-13 ENCOUNTER — Other Ambulatory Visit: Payer: Self-pay

## 2019-05-13 VITALS — BP 144/80 | HR 86 | Ht 59.0 in | Wt 105.8 lb

## 2019-05-13 DIAGNOSIS — E119 Type 2 diabetes mellitus without complications: Secondary | ICD-10-CM | POA: Diagnosis not present

## 2019-05-13 DIAGNOSIS — E139 Other specified diabetes mellitus without complications: Secondary | ICD-10-CM

## 2019-05-13 LAB — POCT GLYCOSYLATED HEMOGLOBIN (HGB A1C): Hemoglobin A1C: 6.3 % — AB (ref 4.0–5.6)

## 2019-05-13 NOTE — Patient Instructions (Addendum)
Your blood pressure is high today.  Please see your primary care provider soon, to have it rechecked Please continue the same medications. check your blood sugar once a day.  vary the time of day when you check, between before the 3 meals, and at bedtime.  also check if you have symptoms of your blood sugar being too high or too low.  please keep a record of the readings and bring it to your next appointment here (or you can bring the meter itself).  You can write it on any piece of paper.  please call us sooner if your blood sugar goes below 70, or if you have a lot of readings over 200. Please come back for a follow-up appointment in 4-5 months.     Hm nay huy?t p c?a b?n t?ng cao. Vui lng g?p nh cung c?p d?ch v? ch?m [place] chnh c?a b?n s?m ?? ???c ki?m tra l?i Hy ti?p t?c cc lo?i thu?c t??ng t?. ki?m tra l??ng ???ng trong mu m?i ngy m?t l?n. thay ??i th?i gian trong ngy khi b?n ki?m tra, gi?a tr??c 3 b?a ?n v tr??c khi ?i ng?. c?ng ki?m tra xem b?n c cc tri?u ch?ng v? l??ng ???ng trong mu c?a b?n qu cao ho?c qu th?p. vui lng ghi l?i cc k?t qu? ?o v mang ??n cu?c h?n ti?p theo c?a b?n t?i ?y (ho?c b?n c th? t? mang my ?o). B?n c th? vi?t n trn b?t k? m?nh gi?y no. Vui lng g?i cho chng ti s?m h?n n?u l??ng ???ng trong mu c?a b?n xu?ng d??i 70, ho?c n?u b?n c nhi?u ch? s? trn 200. Vui lng quay l?i ti khm sau 4-5 thng.

## 2019-05-13 NOTE — Progress Notes (Signed)
Subjective:    Patient ID: Cindy Guerrero, female    DOB: Nov 01, 1950, 69 y.o.   MRN: [id number]  HPI Pt returns for f/u of diabetes mellitus: DM type: 2 Dx'ed: [id number] Complications: none Therapy: 4 oral meds GDM: never DKA: never Severe hypoglycemia: never.  Pancreatitis: never Other: she has never been on insulin, but she has learned how.   Interval history: She says cbg's are in the low-100's.  pt states she feels well in general.  She takes meds as rx'ed.   Past Medical History:  Diagnosis Date  . Diabetes ([place])   . Dyslipidemia   . Headache   . Osteoporosis     No past surgical history on file.  Social History   Socioeconomic History  . Marital status: Married    Spouse name: Not on file  . Number of children: Not on file  . Years of education: Not on file  . Highest education level: Not on file  Occupational History  . Not on file  Tobacco Use  . Smoking status: Never Smoker  . Smokeless tobacco: Never Used  Substance and Sexual Activity  . Alcohol use: No  . Drug use: Not on file  . Sexual activity: Not on file  Other Topics Concern  . Not on file  Social History Narrative  . Not on file   Social Determinants of Health   Financial Resource Strain:   . Difficulty of Paying Living Expenses:   Food Insecurity:   . Worried About Charity fundraiser in the Last Year:   . Arboriculturist in the Last Year:   Transportation Needs:   . Film/video editor (Medical):   Marland Kitchen Lack of Transportation (Non-Medical):   Physical Activity:   . Days of Exercise per Week:   . Minutes of Exercise per Session:   Stress:   . Feeling of Stress :   Social Connections:   . Frequency of Communication with Friends and Family:   . Frequency of Social Gatherings with Friends and Family:   . Attends Religious Services:   . Active Member of Clubs or Organizations:   . Attends Archivist Meetings:   Marland Kitchen Marital Status:   Intimate Partner Violence:   . Fear of Current or  Ex-Partner:   . Emotionally Abused:   Marland Kitchen Physically Abused:   . Sexually Abused:     Current Outpatient Medications on File Prior to Visit  Medication Sig Dispense Refill  . alendronate (FOSAMAX) 70 MG tablet Take 70 mg by mouth once a week. Take with a full glass of water on an empty stomach.    . Calcium Carb-Cholecalciferol 600-800 MG-UNIT TABS Take by mouth.    Marland Kitchen gemfibrozil (LOPID) 600 MG tablet Take 600 mg by mouth 2 (two) times daily before a meal.    . glucose blood (ONETOUCH VERIO) test strip Use to check blood sugar 1 time per day Dx code E11.9 100 each 2  . losartan (COZAAR) 25 MG tablet Take 25 mg by mouth daily.    . metFORMIN (GLUCOPHAGE) 1000 MG tablet Take 1 tablet (1,000 mg total) by mouth 2 (two) times daily with a meal. 180 tablet 3  . OneTouch Delica Lancets 99991111 MISC Use to check blood sugar 1 time per day. Dx code : E11.9 100 each 2  . pioglitazone (ACTOS) 45 MG tablet Take 1 tablet (45 mg total) by mouth daily. 90 tablet 3  . repaglinide (PRANDIN) 0.5 MG tablet Take 1  tablet (0.5 mg total) by mouth 3 (three) times daily before meals. 270 tablet 3  . sitaGLIPtin (JANUVIA) 100 MG tablet Take 1 tablet (100 mg total) by mouth daily. 90 tablet 3  . vitamin E 1000 UNIT capsule Take 1,000 Units by mouth daily.     No current facility-administered medications on file prior to visit.    No Known Allergies  Family History  Problem Relation Age of Onset  . Diabetes Mother     BP (!) 144/80 (BP Location: Right Arm, Patient Position: Sitting, Cuff Size: Normal)   Pulse 86   Ht 4\' 11"  (1.499 m)   Wt 105 lb 12.8 oz (48 kg)   SpO2 96%   BMI 21.37 kg/m    Review of Systems She denies hypoglycemia    Objective:   Physical Exam VITAL SIGNS:  See vs page GENERAL: no distress Pulses: dorsalis pedis intact bilat.   MSK: no deformity of the feet CV: no leg edema Skin:  no ulcer on the feet.  normal color and temp on the feet. Neuro: sensation is intact to touch on the  feet   Lab Results  Component Value Date   HGBA1C 6.3 (A) 05/13/2019       Assessment & Plan:  Type 2 DM: well-controlled HTN: is noted today   Patient Instructions  Your blood pressure is high today.  Please see your primary care provider soon, to have it rechecked Please continue the same medications. check your blood sugar once a day.  vary the time of day when you check, between before the 3 meals, and at bedtime.  also check if you have symptoms of your blood sugar being too high or too low.  please keep a record of the readings and bring it to your next appointment here (or you can bring the meter itself).  You can write it on any piece of paper.  please call us sooner if your blood sugar goes below 70, or if you have a lot of readings over 200. Please come back for a follow-up appointment in 4-5 months.     Hm nay huy?t p c?a b?n t?ng cao. Vui lng g?p nh cung c?p d?ch v? ch?m [place] chnh c?a b?n s?m ?? ???c ki?m tra l?i Hy ti?p t?c cc lo?i thu?c t??ng t?. ki?m tra l??ng ???ng trong mu m?i ngy m?t l?n. thay ??i th?i gian trong ngy khi b?n ki?m tra, gi?a tr??c 3 b?a ?n v tr??c khi ?i ng?. c?ng ki?m tra xem b?n c cc tri?u ch?ng v? l??ng ???ng trong mu c?a b?n qu cao ho?c qu th?p. vui lng ghi l?i cc k?t qu? ?o v mang ??n cu?c h?n ti?p theo c?a b?n t?i ?y (ho?c b?n c th? t? mang my ?o). B?n c th? vi?t n trn b?t k? m?nh gi?y no. Vui lng g?i cho chng ti s?m h?n n?u l??ng ???ng trong mu c?a b?n xu?ng d??i 70, ho?c n?u b?n c nhi?u ch? s? trn 200. Vui lng quay l?i ti khm sau 4-5 thng.

## 2019-10-14 ENCOUNTER — Ambulatory Visit: Payer: Medicare Other | Admitting: Endocrinology

## 2019-12-03 ENCOUNTER — Other Ambulatory Visit: Payer: Self-pay

## 2019-12-03 ENCOUNTER — Ambulatory Visit (INDEPENDENT_AMBULATORY_CARE_PROVIDER_SITE_OTHER): Payer: Medicare Other | Admitting: Endocrinology

## 2019-12-03 VITALS — BP 128/72 | HR 82 | Ht 59.0 in | Wt 106.6 lb

## 2019-12-03 DIAGNOSIS — E139 Other specified diabetes mellitus without complications: Secondary | ICD-10-CM | POA: Diagnosis not present

## 2019-12-03 DIAGNOSIS — Z23 Encounter for immunization: Secondary | ICD-10-CM

## 2019-12-03 LAB — POCT GLYCOSYLATED HEMOGLOBIN (HGB A1C): Hemoglobin A1C: 6.1 % — AB (ref 4.0–5.6)

## 2019-12-03 MED ORDER — PIOGLITAZONE HCL 45 MG PO TABS: 45.0000 mg | ORAL_TABLET | Freq: Every day | ORAL | 3 refills | Status: DC

## 2019-12-03 MED ORDER — METFORMIN HCL 1000 MG PO TABS: 1000.0000 mg | ORAL_TABLET | Freq: Two times a day (BID) | ORAL | 3 refills | Status: DC

## 2019-12-03 MED ORDER — SITAGLIPTIN PHOSPHATE 100 MG PO TABS: 100.0000 mg | ORAL_TABLET | Freq: Every day | ORAL | 3 refills | Status: DC

## 2019-12-03 MED ORDER — REPAGLINIDE 0.5 MG PO TABS: 0.5000 mg | ORAL_TABLET | Freq: Three times a day (TID) | ORAL | 3 refills | Status: DC

## 2019-12-03 NOTE — Progress Notes (Signed)
Subjective:    Patient ID: Cindy Guerrero, female    DOB: 02-Feb-1951, 69 y.o.   MRN: [id number]  HPI Pt returns for f/u of diabetes mellitus: DM type: 2 Dx'ed: 1324 Complications: none Therapy: 4 oral meds GDM: never DKA: never Severe hypoglycemia: never.  Pancreatitis: never Other: she has never been on insulin, but she has learned how.   Interval history: She says cbg's are in the low-100's.  pt states she feels well in general.  She takes meds as rx'ed.   Past Medical History:  Diagnosis Date  . Diabetes ([place])   . Dyslipidemia   . Headache   . Osteoporosis     No past surgical history on file.  Social History   Socioeconomic History  . Marital status: Married    Spouse name: Not on file  . Number of children: Not on file  . Years of education: Not on file  . Highest education level: Not on file  Occupational History  . Not on file  Tobacco Use  . Smoking status: Never Smoker  . Smokeless tobacco: Never Used  Substance and Sexual Activity  . Alcohol use: No  . Drug use: Not on file  . Sexual activity: Not on file  Other Topics Concern  . Not on file  Social History Narrative  . Not on file   Social Determinants of Health   Financial Resource Strain:   . Difficulty of Paying Living Expenses: Not on file  Food Insecurity:   . Worried About Charity fundraiser in the Last Year: Not on file  . Ran Out of Food in the Last Year: Not on file  Transportation Needs:   . Lack of Transportation (Medical): Not on file  . Lack of Transportation (Non-Medical): Not on file  Physical Activity:   . Days of Exercise per Week: Not on file  . Minutes of Exercise per Session: Not on file  Stress:   . Feeling of Stress : Not on file  Social Connections:   . Frequency of Communication with Friends and Family: Not on file  . Frequency of Social Gatherings with Friends and Family: Not on file  . Attends Religious Services: Not on file  . Active Member of Clubs or  Organizations: Not on file  . Attends Archivist Meetings: Not on file  . Marital Status: Not on file  Intimate Partner Violence:   . Fear of Current or Ex-Partner: Not on file  . Emotionally Abused: Not on file  . Physically Abused: Not on file  . Sexually Abused: Not on file    Current Outpatient Medications on File Prior to Visit  Medication Sig Dispense Refill  . alendronate (FOSAMAX) 70 MG tablet Take 70 mg by mouth once a week. Take with a full glass of water on an empty stomach.    . Calcium Carb-Cholecalciferol 600-800 MG-UNIT TABS Take by mouth.    Marland Kitchen gemfibrozil (LOPID) 600 MG tablet Take 600 mg by mouth 2 (two) times daily before a meal.    . glucose blood (ONETOUCH VERIO) test strip Use to check blood sugar 1 time per day Dx code E11.9 100 each 2  . losartan (COZAAR) 25 MG tablet Take 25 mg by mouth daily.    Glory Rosebush Delica Lancets 40N MISC Use to check blood sugar 1 time per day. Dx code : E11.9 100 each 2  . vitamin E 1000 UNIT capsule Take 1,000 Units by mouth daily.  No current facility-administered medications on file prior to visit.    No Known Allergies  Family History  Problem Relation Age of Onset  . Diabetes Mother     BP 128/72 (BP Location: Left Arm, Patient Position: Sitting, Cuff Size: Normal)   Pulse 82   Ht 4\' 11"  (1.499 m)   Wt 106 lb 9.6 oz (48.4 kg)   SpO2 93%   BMI 21.53 kg/m    Review of Systems     Objective:   Physical Exam VITAL SIGNS:  See vs page GENERAL: no distress Pulses: dorsalis pedis intact bilat.   MSK: no deformity of the feet CV: no leg edema Skin:  no ulcer on the feet.  normal color and temp on the feet. Neuro: sensation is intact to touch on the feet   Lab Results  Component Value Date   HGBA1C 6.1 (A) 12/03/2019        Assessment & Plan:  Dyslipidemia: drug interaction is considered.  She is on a low dosage of repaglinide, so we'll continue the same.  Type 2 DM: well-controlled.   Patient  Instructions  Your blood pressure is high today.  Please see your primary care provider soon, to have it rechecked. Please continue the same medications. check your blood sugar once a day.  vary the time of day when you check, between before the 3 meals, and at bedtime.  also check if you have symptoms of your blood sugar being too high or too low.  please keep a record of the readings and bring it to your next appointment here (or you can bring the meter itself).  You can write it on any piece of paper.  please call us sooner if your blood sugar goes below 70, or if you have a lot of readings over 200. Please come back for a follow-up appointment in 6 months.     Hm nay huy?t p c?a b?n t?ng cao. Vui lng g?p nh cung c?p d?ch v? ch?m [place] chnh c?a b?n s?m ?? ???c ki?m tra l?i Hy ti?p t?c cc lo?i thu?c t??ng t?. ki?m tra l??ng ???ng trong mu m?i ngy m?t l?n. thay ??i th?i gian trong ngy khi b?n ki?m tra, gi?a tr??c 3 b?a ?n v tr??c khi ?i ng?. c?ng ki?m tra xem b?n c cc tri?u ch?ng v? l??ng ???ng trong mu c?a b?n qu cao ho?c qu th?p. vui lng ghi l?i cc k?t qu? ?o v mang ??n cu?c h?n ti?p theo c?a b?n t?i ?y (ho?c b?n c th? t? mang my ?o). B?n c th? vi?t n trn b?t k? m?nh gi?y no. Vui lng g?i cho chng ti s?m h?n n?u l??ng ???ng trong mu c?a b?n xu?ng d??i 70, ho?c n?u b?n c nhi?u ch? s? trn 200. Vui lng quay l?i ti khm sau 6 thng.

## 2019-12-03 NOTE — Patient Instructions (Addendum)
Your blood pressure is high today.  Please see your primary care provider soon, to have it rechecked. Please continue the same medications. check your blood sugar once a day.  vary the time of day when you check, between before the 3 meals, and at bedtime.  also check if you have symptoms of your blood sugar being too high or too low.  please keep a record of the readings and bring it to your next appointment here (or you can bring the meter itself).  You can write it on any piece of paper.  please call us sooner if your blood sugar goes below 70, or if you have a lot of readings over 200. Please come back for a follow-up appointment in 6 months.     Hm nay huy?t p c?a b?n t?ng cao. Vui lng g?p nh cung c?p d?ch v? ch?m  chnh c?a b?n s?m ?? ???c ki?m tra l?i Hy ti?p t?c cc lo?i thu?c t??ng t?. ki?m tra l??ng ???ng trong mu m?i ngy m?t l?n. thay ??i th?i gian trong ngy khi b?n ki?m tra, gi?a tr??c 3 b?a ?n v tr??c khi ?i ng?. c?ng ki?m tra xem b?n c cc tri?u ch?ng v? l??ng ???ng trong mu c?a b?n qu cao ho?c qu th?p. vui lng ghi l?i cc k?t qu? ?o v mang ??n cu?c h?n ti?p theo c?a b?n t?i ?y (ho?c b?n c th? t? mang my ?o). B?n c th? vi?t n trn b?t k? m?nh gi?y no. Vui lng g?i cho chng ti s?m h?n n?u l??ng ???ng trong mu c?a b?n xu?ng d??i 70, ho?c n?u b?n c nhi?u ch? s? trn 200. Vui lng quay l?i ti khm sau 6 thng.

## 2020-03-06 ENCOUNTER — Other Ambulatory Visit: Payer: Self-pay | Admitting: Endocrinology

## 2020-06-02 ENCOUNTER — Ambulatory Visit (INDEPENDENT_AMBULATORY_CARE_PROVIDER_SITE_OTHER): Payer: Medicare Other | Admitting: Endocrinology

## 2020-06-02 ENCOUNTER — Other Ambulatory Visit: Payer: Self-pay

## 2020-06-02 DIAGNOSIS — E139 Other specified diabetes mellitus without complications: Secondary | ICD-10-CM | POA: Diagnosis not present

## 2020-06-02 LAB — POCT GLYCOSYLATED HEMOGLOBIN (HGB A1C): Hemoglobin A1C: 6 % — AB (ref 4.0–5.6)

## 2020-06-02 MED ORDER — ONETOUCH VERIO VI STRP: 1.0000 | ORAL_STRIP | Freq: Every day | 3 refills | Status: DC

## 2020-06-02 NOTE — Patient Instructions (Addendum)
Your blood pressure is high today.  Please see your primary care provider soon, to have it rechecked.   Please continue the same medications.   check your blood sugar once a day.  vary the time of day when you check, between before the 3 meals, and at bedtime.  also check if you have symptoms of your blood sugar being too high or too low.  please keep a record of the readings and bring it to your next appointment here (or you can bring the meter itself).  You can write it on any piece of paper.  please call us sooner if your blood sugar goes below 70, or if you have a lot of readings over 200.   Please come back for a follow-up appointment in 6 months.   

## 2020-06-02 NOTE — Progress Notes (Signed)
Subjective:    Patient ID: Cindy Guerrero, female    DOB: [birth year], 70 y.o.   MRN: [id number]  HPI Pt returns for f/u of diabetes mellitus: DM type: 2 Dx'ed: 4239 Complications: none Therapy: 4 oral meds GDM: never DKA: never Severe hypoglycemia: never.  Pancreatitis: never Other: she has never been on insulin, but she has learned how.   Interval history: She says cbg's are in the low-100's.  pt states she feels well in general.  She takes meds as rx'ed.   Past Medical History:  Diagnosis Date  . Diabetes ([place])   . Dyslipidemia   . Headache   . Osteoporosis     No past surgical history on file.  Social History   Socioeconomic History  . Marital status: Married    Spouse name: Not on file  . Number of children: Not on file  . Years of education: Not on file  . Highest education level: Not on file  Occupational History  . Not on file  Tobacco Use  . Smoking status: Never Smoker  . Smokeless tobacco: Never Used  Substance and Sexual Activity  . Alcohol use: No  . Drug use: Not on file  . Sexual activity: Not on file  Other Topics Concern  . Not on file  Social History Narrative  . Not on file   Social Determinants of Health   Financial Resource Strain: Not on file  Food Insecurity: Not on file  Transportation Needs: Not on file  Physical Activity: Not on file  Stress: Not on file  Social Connections: Not on file  Intimate Partner Violence: Not on file    Current Outpatient Medications on File Prior to Visit  Medication Sig Dispense Refill  . alendronate (FOSAMAX) 70 MG tablet Take 70 mg by mouth once a week. Take with a full glass of water on an empty stomach.    . Calcium Carb-Cholecalciferol 600-800 MG-UNIT TABS Take by mouth.    Marland Kitchen gemfibrozil (LOPID) 600 MG tablet Take 600 mg by mouth 2 (two) times daily before a meal.    . Lancets (ONETOUCH DELICA PLUS [id number]) MISC USE 1  TO CHECK GLUCOSE ONCE DAILY 100 each 5  . losartan (COZAAR) 25 MG tablet Take  25 mg by mouth daily.    . metFORMIN (GLUCOPHAGE) 1000 MG tablet Take 1 tablet (1,000 mg total) by mouth 2 (two) times daily with a meal. 180 tablet 3  . pioglitazone (ACTOS) 45 MG tablet Take 1 tablet (45 mg total) by mouth daily. 90 tablet 3  . repaglinide (PRANDIN) 0.5 MG tablet Take 1 tablet (0.5 mg total) by mouth 3 (three) times daily before meals. 270 tablet 3  . sitaGLIPtin (JANUVIA) 100 MG tablet Take 1 tablet (100 mg total) by mouth daily. 90 tablet 3  . vitamin E 1000 UNIT capsule Take 1,000 Units by mouth daily.     No current facility-administered medications on file prior to visit.    No Known Allergies  Family History  Problem Relation Age of Onset  . Diabetes Mother     There were no vitals taken for this visit.   Review of Systems She denies hypoglycemia    Objective:   Physical Exam GENERAL: no distress Pulses: dorsalis pedis intact bilat.   MSK: no deformity of the feet CV: no leg edema Skin:  no ulcer on the feet.  normal color and temp on the feet. Neuro: sensation is intact to touch on the feet  Lab Results  Component Value Date   HGBA1C 6.0 (A) 06/02/2020       Assessment & Plan:  HTN: is noted today Type 2 DM: well-controlled.   Patient Instructions  Your blood pressure is high today.  Please see your primary care provider soon, to have it rechecked. Please continue the same medications. check your blood sugar once a day.  vary the time of day when you check, between before the 3 meals, and at bedtime.  also check if you have symptoms of your blood sugar being too high or too low.  please keep a record of the readings and bring it to your next appointment here (or you can bring the meter itself).  You can write it on any piece of paper.  please call us sooner if your blood sugar goes below 70, or if you have a lot of readings over 200.   Please come back for a follow-up appointment in 6 months.

## 2021-01-23 ENCOUNTER — Other Ambulatory Visit: Payer: Self-pay | Admitting: Endocrinology

## 2021-03-13 ENCOUNTER — Other Ambulatory Visit: Payer: Self-pay | Admitting: Endocrinology

## 2021-06-01 ENCOUNTER — Encounter: Payer: Self-pay | Admitting: Endocrinology

## 2021-06-01 ENCOUNTER — Ambulatory Visit (INDEPENDENT_AMBULATORY_CARE_PROVIDER_SITE_OTHER): Payer: Medicare Other | Admitting: Endocrinology

## 2021-06-01 VITALS — BP 158/78 | HR 82 | Ht 59.0 in | Wt 102.2 lb

## 2021-06-01 DIAGNOSIS — E139 Other specified diabetes mellitus without complications: Secondary | ICD-10-CM

## 2021-06-01 LAB — BASIC METABOLIC PANEL
BUN: 22 mg/dL (ref 6–23)
CO2: 27 mEq/L (ref 19–32)
Calcium: 10.1 mg/dL (ref 8.4–10.5)
Chloride: 99 mEq/L (ref 96–112)
Creatinine, Ser: 0.84 mg/dL (ref 0.40–1.20)
GFR: 69.57 mL/min (ref >60.00)
Glucose, Bld: 193 mg/dL — ABNORMAL HIGH (ref 70–99)
Potassium: 4.3 mEq/L (ref 3.5–5.1)
Sodium: 136 mEq/L (ref 135–145)

## 2021-06-01 LAB — T4, FREE: Free T4: 0.76 ng/dL (ref 0.60–1.60)

## 2021-06-01 LAB — TSH: TSH: 3.08 u[IU]/mL (ref 0.35–5.50)

## 2021-06-01 LAB — POCT GLYCOSYLATED HEMOGLOBIN (HGB A1C): Hemoglobin A1C: 8 % — AB (ref 4.0–5.6)

## 2021-06-01 MED ORDER — LOSARTAN POTASSIUM 25 MG PO TABS: 25.0000 mg | ORAL_TABLET | Freq: Every day | ORAL | 3 refills | Status: DC

## 2021-06-01 MED ORDER — PIOGLITAZONE HCL 45 MG PO TABS: 45.0000 mg | ORAL_TABLET | Freq: Every day | ORAL | 3 refills | Status: DC

## 2021-06-01 MED ORDER — METFORMIN HCL ER 500 MG PO TB24: 2000.0000 mg | ORAL_TABLET | Freq: Every day | ORAL | 3 refills | Status: DC

## 2021-06-01 MED ORDER — ONETOUCH VERIO VI STRP: 1.0000 | ORAL_STRIP | Freq: Every day | 3 refills | Status: DC

## 2021-06-01 NOTE — Patient Instructions (Addendum)
Your blood pressure is high today.  Please see your primary care provider soon, to have it rechecked.   ?I have sent a prescription to your pharmacy, to resume pioglitazone, metformin, and losartan.    ?check your blood sugar once a day.  vary the time of day when you check, between before the 3 meals, and at bedtime.  also check if you have symptoms of your blood sugar being too high or too low.  please keep a record of the readings and bring it to your next appointment here (or you can bring the meter itself).  You can write it on any piece of paper.  please call us sooner if your blood sugar goes below 70, or if you have a lot of readings over 200.   ?Please come back for a follow-up appointment in 3 months.   ? ?[place].   ?Address: [address] [place], [place], [place] [address] ?Phone: [phone] ? ?H?m nay huy?t ?p c?a b?n cao. Vui l?ng s?m g?p nh? cung c?p d?ch v? ch?m s?c ch?nh c?a b?n ?? ???c ki?m tra l?i. ?T?i ?? g?i ??n thu?c ??n hi?u thu?c c?a b?n, ti?p t?c d?ng pioglitazone, metformin v? losartan. ?ki?m tra l??ng ???ng trong m?u c?a b?n m?i ng?y m?t l?n. thay ??i th?i gian trong ng?y khi b?n ki?m tra, gi?a tr??c 3 b?a ?n v? tr??c khi ?i ng?. ??ng th?i ki?m tra xem b?n c? c?c tri?u ch?ng c?a l??ng ???ng trong m?u qu? cao ho?c qu? th?p hay kh?ng. vui l?ng ghi l?i c?c l?n ??c v? mang ??n cu?c h?n ti?p theo c?a b?n t?i ??y (ho?c b?n c? th? t? mang theo m?y ?o). B?n c? th? vi?t n? tr?n b?t k? m?nh gi?y n?o. vui l?ng g?i cho ch?ng t?i s?m h?n n?u l??ng ???ng trong m?u c?a b?n xu?ng d??i 70 ho?c n?u b?n c? nhi?u ch? s? tr?n 200. ?Xin h?n t?i kh?m sau 3 th?ng.  ? ?

## 2021-06-01 NOTE — Progress Notes (Signed)
? ?Subjective:  ? ? Patient ID: Cindy Guerrero, female    DOB: 1950-10-15, 71 y.o.   MRN: [id number] ? ?HPI ?Pt returns for f/u of diabetes mellitus: ?DM type: 2 ?Dx'ed: 1998 ?Complications: none ?Therapy: 4 oral meds ?GDM: never ?DKA: never ?Severe hypoglycemia: never.  ?Pancreatitis: never ?Other: she has never been on insulin, but she has learned how.   ?Interval history: pt states she feels well in general.  She has not recently taken meds.  She requests a new meter.   ?Past Medical History:  ?Diagnosis Date  ? Diabetes ([place])   ? Dyslipidemia   ? Headache   ? Osteoporosis   ? ? ?No past surgical history on file. ? ?Social History  ? ?Socioeconomic History  ? Marital status: Married  ?  Spouse name: Not on file  ? Number of children: Not on file  ? Years of education: Not on file  ? Highest education level: Not on file  ?Occupational History  ? Not on file  ?Tobacco Use  ? Smoking status: Never  ? Smokeless tobacco: Never  ?Substance and Sexual Activity  ? Alcohol use: No  ? Drug use: Not on file  ? Sexual activity: Not on file  ?Other Topics Concern  ? Not on file  ?Social History Narrative  ? Not on file  ? ?Social Determinants of Health  ? ?Financial Resource Strain: Not on file  ?Food Insecurity: Not on file  ?Transportation Needs: Not on file  ?Physical Activity: Not on file  ?Stress: Not on file  ?Social Connections: Not on file  ?Intimate Partner Violence: Not on file  ? ? ?Current Outpatient Medications on File Prior to Visit  ?Medication Sig Dispense Refill  ? alendronate (FOSAMAX) 70 MG tablet Take 70 mg by mouth once a week. Take with a full glass of water on an empty stomach.    ? Calcium Carb-Cholecalciferol 600-800 MG-UNIT TABS Take by mouth.    ? gemfibrozil (LOPID) 600 MG tablet Take 600 mg by mouth 2 (two) times daily before a meal.    ? Lancets (ONETOUCH DELICA PLUS [id number]) MISC USE 1  TO CHECK GLUCOSE ONCE DAILY 100 each 5  ? vitamin E 1000 UNIT capsule Take 1,000 Units by mouth daily.    ? ?No  current facility-administered medications on file prior to visit.  ? ? ?No Known Allergies ? ?Family History  ?Problem Relation Age of Onset  ? Diabetes Mother   ? ? ?BP (!) 158/78 (BP Location: Left Arm, Patient Position: Sitting, Cuff Size: Normal)   Pulse 82   Ht '4\' 11"'$  (1.499 m)   Wt 102 lb 3.2 oz (46.4 kg)   SpO2 98%   BMI 20.64 kg/m?  ? ? ?Review of Systems ? ?   ?Objective:  ? Physical Exam ?VITAL SIGNS:  See vs page ?GENERAL: no distress ? ? ?Lab Results  ?Component Value Date  ? HGBA1C 8.0 (A) 06/01/2021  ? ?   ?Assessment & Plan:  ?Type 2 DM: uncontrolled, due to being off meds.  Based on this A1c, she does not need to resume all meds ? ? ?Patient Instructions  ?Your blood pressure is high today.  Please see your primary care provider soon, to have it rechecked.   ?I have sent a prescription to your pharmacy, to resume pioglitazone, metformin, and losartan.    ?check your blood sugar once a day.  vary the time of day when you check, between before the 3 meals, and at  bedtime.  also check if you have symptoms of your blood sugar being too high or too low.  please keep a record of the readings and bring it to your next appointment here (or you can bring the meter itself).  You can write it on any piece of paper.  please call us sooner if your blood sugar goes below 70, or if you have a lot of readings over 200.   ?Please come back for a follow-up appointment in 3 months.   ? ?[facility].   ?Address: [address] [place], [place], [place] [address] ?Phone: [phone] ? ?H?m nay huy?t ?p c?a b?n cao. Vui l?ng s?m g?p nh? cung c?p d?ch v? ch?m s?c ch?nh c?a b?n ?? ???c ki?m tra l?i. ?T?i ?? g?i ??n thu?c ??n hi?u thu?c c?a b?n, ti?p t?c d?ng pioglitazone, metformin v? losartan. ?ki?m tra l??ng ???ng trong m?u c?a b?n m?i ng?y m?t l?n. thay ??i th?i gian trong ng?y khi b?n ki?m tra, gi?a tr??c 3 b?a ?n v? tr??c khi ?i ng?. ??ng th?i ki?m tra xem b?n c? c?c tri?u ch?ng c?a l??ng ???ng  trong m?u qu? cao ho?c qu? th?p hay kh?ng. vui l?ng ghi l?i c?c l?n ??c v? mang ??n cu?c h?n ti?p theo c?a b?n t?i ??y (ho?c b?n c? th? t? mang theo m?y ?o). B?n c? th? vi?t n? tr?n b?t k? m?nh gi?y n?o. vui l?ng g?i cho ch?ng t?i s?m h?n n?u l??ng ???ng trong m?u c?a b?n xu?ng d??i 70 ho?c n?u b?n c? nhi?u ch? s? tr?n 200. ?Xin h?n t?i kh?m sau 3 th?ng.  ? ? ? ?

## 2021-10-16 ENCOUNTER — Ambulatory Visit (INDEPENDENT_AMBULATORY_CARE_PROVIDER_SITE_OTHER): Payer: Medicare Other | Admitting: Family Medicine

## 2021-10-16 ENCOUNTER — Encounter: Payer: Self-pay | Admitting: Family Medicine

## 2021-10-16 VITALS — BP 160/70 | HR 79 | Temp 98.0°F | Ht 59.0 in | Wt 107.5 lb

## 2021-10-16 DIAGNOSIS — M816 Localized osteoporosis [Lequesne]: Secondary | ICD-10-CM

## 2021-10-16 DIAGNOSIS — E1165 Type 2 diabetes mellitus with hyperglycemia: Secondary | ICD-10-CM | POA: Diagnosis not present

## 2021-10-16 DIAGNOSIS — R030 Elevated blood-pressure reading, without diagnosis of hypertension: Secondary | ICD-10-CM | POA: Diagnosis not present

## 2021-10-16 MED ORDER — ALENDRONATE SODIUM 70 MG PO TABS: 70.0000 mg | ORAL_TABLET | ORAL | 1 refills | Status: DC

## 2021-10-16 MED ORDER — BLOOD PRESSURE MONITOR/ARM DEVI: 1.0000 | Freq: Once | 0 refills | Status: AC

## 2021-10-16 NOTE — Patient Instructions (Signed)
Welcome to [facility]! It was a pleasure meeting you today.  As discussed, Please schedule a 6 month follow up visit today.  Send [place] the name of the glucometer so we can send supplies in   PLEASE NOTE:  If you had any LAB tests please let us know if you have not heard back within a few days. You may see your results on MyChart before we have a chance to review them but we will give you a call once they are reviewed by [place]. If we ordered any REFERRALS today, please let us know if you have not heard from their office within the next week.  Let us know through MyChart if you are needing REFILLS, or have your pharmacy send [place] the request. You can also use MyChart to communicate with me or any office staff.  Please try these tips to maintain a healthy lifestyle:  Eat most of your calories during the day when you are active. Eliminate processed foods including packaged sweets (pies, cakes, cookies), reduce intake of potatoes, white bread, white pasta, and white rice. Look for whole grain options, oat flour or almond flour.  Each meal should contain half fruits/vegetables, one quarter protein, and one quarter carbs (no bigger than a computer mouse).  Cut down on sweet beverages. This includes juice, soda, and sweet tea. Also watch fruit intake, though this is a healthier sweet option, it still contains natural sugar! Limit to 3 servings daily.  Drink at least 1 glass of water with each meal and aim for at least 8 glasses per day  Exercise at least 150 minutes every week.

## 2021-10-16 NOTE — Progress Notes (Signed)
New Patient Office Visit  Subjective:  Patient ID: Cindy Guerrero, female    DOB: 08-01-1950  Age: 71 y.o. MRN: [id number]  CC:  Chief Complaint  Patient presents with   Establish Care    Need new pcp, pcp retired, would like to start back on fosamax     HPI Cindy Guerrero presents for new pt-from [place]. Here w/interpreter.  Osteoporosis-off fosamax for 2 yrs(PCP retired). Was on for 9 yrs.   No h/o fx. DXA around 2016.  Pt requesting to get back on meds.  Neck pain-was better when on fosamax.  Neck pain better.now  Ho pain L leg-few yrs ago.  Walking helped.  No pain in long time Throat itches constantly for 3-4 days. No runny nose, no sore throat, then will cough. No heartburn. No f/c.   Type 2 diabetes with hyperglycemia-patient last saw endocrinologist in March.  She has been out of the country since then.  She does have a glucometer, but the strips are the wrong type so is unable to check her sugars.  She would like to get her labs checked today.  We will  Past Medical History:  Diagnosis Date   Diabetes ([place])    Dyslipidemia    Headache    Osteoporosis     Past Surgical History:  Procedure Laterality Date   EYE SURGERY     cataract surgery    Family History  Problem Relation Age of Onset   Diabetes Mother    Hyperlipidemia Daughter    Diabetes Daughter    Asthma Daughter     Social History   Socioeconomic History   Marital status: Married    Spouse name: Not on file   Number of children: 6   Years of education: Not on file   Highest education level: Not on file  Occupational History   Not on file  Tobacco Use   Smoking status: Never   Smokeless tobacco: Never  Vaping Use   Vaping Use: Never used  Substance and Sexual Activity   Alcohol use: No   Drug use: Never   Sexual activity: Not Currently  Other Topics Concern   Not on file  Social History Narrative   Retired Estate manager/land agent   12 grands   9 greats   Social Determinants of Health   Financial  Resource Strain: Not on file  Food Insecurity: Not on file  Transportation Needs: Not on file  Physical Activity: Not on file  Stress: Not on file  Social Connections: Not on file  Intimate Partner Violence: Not on file    ROS  ROS: Gen: no fever, chills  Skin: no rash, itching ENT: no ear pain, ear drainage, nasal congestion, rhinorrhea, sinus pressure, sore throat Eyes: no blurry vision, double vision Resp: no cough, wheeze,SOB CV: no CP, palpitations, LE edema,  GI: no heartburn, n/v/d/c, abd pain GU: no dysuria, urgency, frequency, hematuria MSK: no joint pain, myalgias, back pain Neuro: no dizziness, headache, weakness, vertigo Psych: no depression, anxiety, insomnia, SI   Objective:   Today's Vitals: BP (!) 160/70   Pulse 79   Temp 98 F (36.7 C) (Temporal)   Ht '4\' 11"'$  (1.499 m)   Wt 107 lb 8 oz (48.8 kg)   SpO2 97%   BMI 21.71 kg/m   Physical Exam  Gen: WDWN NAD HEENT: NCAT, conjunctiva not injected, sclera nonicteric NECK:  supple, no thyromegaly, no nodes, no carotid bruits CARDIAC: RRR, S1S2+, no murmur. DP 2+B LUNGS: CTAB. No  wheezes ABDOMEN:  BS+, soft, NTND, No HSM, no masses EXT:  no edema MSK: no gross abnormalities.  NEURO: A&O x3.  CN II-XII intact.  PSYCH: normal mood. Good eye contact   Total of 80mn spent w/pt getting hx, using interpreter, explaining to granddaughter plan(interpreter had to leave), reviewing chart, etc.   Assessment & Plan:   Problem List Items Addressed This Visit       Endocrine   Type 2 diabetes mellitus with hyperglycemia, without long-term current use of insulin (HCC)   Relevant Orders   Comprehensive metabolic panel   Hemoglobin A1c   Lipid panel   TSH   Microalbumin / creatinine urine ratio   CBC with Differential/Platelet   Vitamin B12     Musculoskeletal and Integument   Osteoporosis - Primary   Relevant Medications   VITAMIN D PO   alendronate (FOSAMAX) 70 MG tablet   Other Relevant Orders   DG  BONE DENSITY (DXA)   Comprehensive metabolic panel   TSH   VITAMIN D 25 Hydroxy (Vit-D Deficiency, Fractures)   Other Visit Diagnoses     Elevated blood pressure reading       Relevant Medications   Blood Pressure Monitoring (BLOOD PRESSURE MONITOR/ARM) DEVI     1.  Type 2 diabetes with hyperglycemia-chronic.  Control unknown.  Continue metformin 2000 mg daily, Actos 45 mg daily.  Has follow-up on October 31 with endocrinologist.  Advised to call with brand of glucometer so we can send in supplies and lancets.  Check CBC, B12, microalbumin creatinine ratio, TSH, lipids, A1c, CMP. 2.  Elevated blood pressure-in review of notes, I am not sure if she is on losartan 25 mg for blood pressure or for kidney protection.  Prescription written for blood pressure cuff she is to check her blood pressures daily for couple of weeks.  Advised to send results.  May need to increase losartan to 50 mg daily 3.  Osteoporosis-last bone density was 2016.  T score -2.5.  Patient had been on Fosamax for 8 to 9 years.  She has been off since 2020 due to no PCP and [place]  She is requesting to be put back on Fosamax 70 mg weekly.  Will check TSH, vitamin D.  Will order bone density (there is a backlog and will probably be next year reports done).  Continue exercise  Extra time was spent with patient through the interpreter.  I also need to review records.  Follow-up in 6 months  Outpatient Encounter Medications as of 10/16/2021  Medication Sig   Blood Pressure Monitoring (BLOOD PRESSURE MONITOR/ARM) DEVI 1 each by Does not apply route once for 1 dose.   Calcium Carb-Cholecalciferol 600-800 MG-UNIT TABS Take by mouth.   glucose blood (ONETOUCH VERIO) test strip 1 each by Other route daily. And lancets 1/day.  E11.9   Lancets (ONETOUCH DELICA PLUS [id number] MISC USE 1  TO CHECK GLUCOSE ONCE DAILY   losartan (COZAAR) 25 MG tablet Take 1 tablet (25 mg total) by mouth daily.   metFORMIN (GLUCOPHAGE-XR) 500  MG 24 hr tablet Take 4 tablets (2,000 mg total) by mouth daily.   pioglitazone (ACTOS) 45 MG tablet Take 1 tablet (45 mg total) by mouth daily.   Pyridoxine HCl (B-6 PO) Take 1 tablet by mouth daily.   VITAMIN D PO Take 1 capsule by mouth daily.   vitamin E 1000 UNIT capsule Take 1,000 Units by mouth daily.   alendronate (FOSAMAX) 70 MG tablet Take 1  tablet (70 mg total) by mouth once a week. Take with a full glass of water on an empty stomach.   gemfibrozil (LOPID) 600 MG tablet Take 600 mg by mouth 2 (two) times daily before a meal. (Patient not taking: Reported on 10/16/2021)   [DISCONTINUED] alendronate (FOSAMAX) 70 MG tablet Take 70 mg by mouth once a week. Take with a full glass of water on an empty stomach. (Patient not taking: Reported on 10/16/2021)   No facility-administered encounter medications on file as of 10/16/2021.    Follow-up: No follow-ups on file.   Wellington Hampshire, MD

## 2021-10-17 ENCOUNTER — Other Ambulatory Visit: Payer: Self-pay | Admitting: *Deleted

## 2021-10-17 LAB — CBC WITH DIFFERENTIAL/PLATELET
Basophils Absolute: 0.1 10*3/uL (ref 0.0–0.1)
Basophils Relative: 1 % (ref 0.0–3.0)
Eosinophils Absolute: 0.2 10*3/uL (ref 0.0–0.7)
Eosinophils Relative: 2.6 % (ref 0.0–5.0)
HCT: 34.5 % — ABNORMAL LOW (ref 36.0–46.0)
Hemoglobin: 11.2 g/dL — ABNORMAL LOW (ref 12.0–15.0)
Lymphocytes Relative: 44 % (ref 12.0–46.0)
Lymphs Abs: 3.3 10*3/uL (ref 0.7–4.0)
MCHC: 32.5 g/dL (ref 30.0–36.0)
MCV: 80 fl (ref 78.0–100.0)
Monocytes Absolute: 0.5 10*3/uL (ref 0.1–1.0)
Monocytes Relative: 7 % (ref 3.0–12.0)
Neutro Abs: 3.4 10*3/uL (ref 1.4–7.7)
Neutrophils Relative %: 45.4 % (ref 43.0–77.0)
Platelets: 303 10*3/uL (ref 150.0–400.0)
RBC: 4.32 Mil/uL (ref 3.87–5.11)
RDW: 13.8 % (ref 11.5–15.5)
WBC: 7.6 10*3/uL (ref 4.0–10.5)

## 2021-10-17 LAB — COMPREHENSIVE METABOLIC PANEL
ALT: 19 U/L (ref 0–35)
AST: 20 U/L (ref 0–37)
Albumin: 4.6 g/dL (ref 3.5–5.2)
Alkaline Phosphatase: 59 U/L (ref 39–117)
BUN: 17 mg/dL (ref 6–23)
CO2: 29 mEq/L (ref 19–32)
Calcium: 10.2 mg/dL (ref 8.4–10.5)
Chloride: 101 mEq/L (ref 96–112)
Creatinine, Ser: 0.94 mg/dL (ref 0.40–1.20)
GFR: 60.62 mL/min (ref >60.00)
Glucose, Bld: 87 mg/dL (ref 70–99)
Potassium: 4.6 mEq/L (ref 3.5–5.1)
Sodium: 140 mEq/L (ref 135–145)
Total Bilirubin: 0.3 mg/dL (ref 0.2–1.2)
Total Protein: 7.8 g/dL (ref 6.0–8.3)

## 2021-10-17 LAB — VITAMIN B12: Vitamin B-12: >1500 pg/mL — ABNORMAL HIGH (ref 211–911)

## 2021-10-17 LAB — LDL CHOLESTEROL, DIRECT: Direct LDL: 136 mg/dL

## 2021-10-17 LAB — LIPID PANEL
Cholesterol: 265 mg/dL — ABNORMAL HIGH (ref 0–200)
HDL: 53.5 mg/dL (ref >39.00)
Total CHOL/HDL Ratio: 5
Triglycerides: 528 mg/dL — ABNORMAL HIGH (ref 0.0–149.0)

## 2021-10-17 LAB — VITAMIN D 25 HYDROXY (VIT D DEFICIENCY, FRACTURES): VITD: 44.21 ng/mL (ref 30.00–100.00)

## 2021-10-17 LAB — MICROALBUMIN / CREATININE URINE RATIO
Creatinine,U: 11.1 mg/dL
Microalb Creat Ratio: 24.7 mg/g (ref 0.0–30.0)
Microalb, Ur: 2.7 mg/dL — ABNORMAL HIGH (ref 0.0–1.9)

## 2021-10-17 LAB — TSH: TSH: 3.78 u[IU]/mL (ref 0.35–5.50)

## 2021-10-17 LAB — HEMOGLOBIN A1C: Hgb A1c MFr Bld: 8 % — ABNORMAL HIGH (ref 4.6–6.5)

## 2021-10-17 MED ORDER — ONETOUCH ULTRASOFT LANCETS MISC: 12 refills | Status: DC

## 2021-10-17 MED ORDER — ONETOUCH VERIO VI STRP: 1.0000 | ORAL_STRIP | Freq: Every day | 3 refills | Status: DC

## 2021-10-17 NOTE — Progress Notes (Signed)
1.  Diabetes-A1C too high.  Suggest adding Jardiance '10mg'$  if willing 2.  Choesterol/trigs way too high.  Add fenofibrate '160mg'$  daily and crestor '10mg'$  daily.  3.  A little anemia-we need to monitor.  Repeat cbc,iron studies 1 month

## 2021-10-18 ENCOUNTER — Other Ambulatory Visit: Payer: Self-pay | Admitting: *Deleted

## 2021-10-18 DIAGNOSIS — E1169 Type 2 diabetes mellitus with other specified complication: Secondary | ICD-10-CM

## 2021-10-18 DIAGNOSIS — E1165 Type 2 diabetes mellitus with hyperglycemia: Secondary | ICD-10-CM

## 2021-10-18 DIAGNOSIS — N912 Amenorrhea, unspecified: Secondary | ICD-10-CM

## 2021-10-18 MED ORDER — EMPAGLIFLOZIN 10 MG PO TABS: 10.0000 mg | ORAL_TABLET | Freq: Every day | ORAL | 3 refills | Status: DC

## 2021-10-18 MED ORDER — ROSUVASTATIN CALCIUM 10 MG PO TABS: 10.0000 mg | ORAL_TABLET | Freq: Every day | ORAL | 3 refills | Status: DC

## 2021-10-18 MED ORDER — FENOFIBRATE 160 MG PO TABS: 160.0000 mg | ORAL_TABLET | Freq: Every day | ORAL | 3 refills | Status: DC

## 2021-10-26 ENCOUNTER — Emergency Department (HOSPITAL_BASED_OUTPATIENT_CLINIC_OR_DEPARTMENT_OTHER): Payer: Medicare Other | Admitting: Radiology

## 2021-10-26 ENCOUNTER — Emergency Department (HOSPITAL_BASED_OUTPATIENT_CLINIC_OR_DEPARTMENT_OTHER)
Admission: EM | Admit: 2021-10-26 | Discharge: 2021-10-26 | Disposition: A | Discharge status: Home / Self Care | Payer: Medicare Other | Attending: Emergency Medicine | Admitting: Emergency Medicine

## 2021-10-26 ENCOUNTER — Telehealth: Payer: Self-pay | Admitting: Family Medicine

## 2021-10-26 ENCOUNTER — Other Ambulatory Visit: Payer: Self-pay

## 2021-10-26 ENCOUNTER — Encounter (HOSPITAL_BASED_OUTPATIENT_CLINIC_OR_DEPARTMENT_OTHER): Payer: Self-pay

## 2021-10-26 DIAGNOSIS — Z20822 Contact with and (suspected) exposure to covid-19: Secondary | ICD-10-CM | POA: Insufficient documentation

## 2021-10-26 DIAGNOSIS — I10 Essential (primary) hypertension: Secondary | ICD-10-CM | POA: Diagnosis not present

## 2021-10-26 DIAGNOSIS — Z79899 Other long term (current) drug therapy: Secondary | ICD-10-CM | POA: Diagnosis not present

## 2021-10-26 DIAGNOSIS — R739 Hyperglycemia, unspecified: Secondary | ICD-10-CM | POA: Diagnosis not present

## 2021-10-26 DIAGNOSIS — N390 Urinary tract infection, site not specified: Secondary | ICD-10-CM | POA: Insufficient documentation

## 2021-10-26 DIAGNOSIS — R079 Chest pain, unspecified: Secondary | ICD-10-CM | POA: Diagnosis not present

## 2021-10-26 DIAGNOSIS — R42 Dizziness and giddiness: Secondary | ICD-10-CM | POA: Diagnosis not present

## 2021-10-26 LAB — CBC
HCT: 35.3 % — ABNORMAL LOW (ref 36.0–46.0)
Hemoglobin: 11.3 g/dL — ABNORMAL LOW (ref 12.0–15.0)
MCH: 25.7 pg — ABNORMAL LOW (ref 26.0–34.0)
MCHC: 32 g/dL (ref 30.0–36.0)
MCV: 80.4 fL (ref 80.0–100.0)
Platelets: 303 10*3/uL (ref 150–400)
RBC: 4.39 MIL/uL (ref 3.87–5.11)
RDW: 13.2 % (ref 11.5–15.5)
WBC: 6.6 10*3/uL (ref 4.0–10.5)
nRBC: 0 % (ref 0.0–0.2)

## 2021-10-26 LAB — BASIC METABOLIC PANEL
Anion gap: 11 (ref 5–15)
BUN: 20 mg/dL (ref 8–23)
CO2: 28 mmol/L (ref 22–32)
Calcium: 10.3 mg/dL (ref 8.9–10.3)
Chloride: 101 mmol/L (ref 98–111)
Creatinine, Ser: 1.1 mg/dL — ABNORMAL HIGH (ref 0.44–1.00)
GFR, Estimated: 53 mL/min — ABNORMAL LOW (ref >60)
Glucose, Bld: 183 mg/dL — ABNORMAL HIGH (ref 70–99)
Potassium: 3.5 mmol/L (ref 3.5–5.1)
Sodium: 140 mmol/L (ref 135–145)

## 2021-10-26 LAB — RESP PANEL BY RT-PCR (FLU A&B, COVID) ARPGX2
Influenza A by PCR: NEGATIVE
Influenza B by PCR: NEGATIVE
SARS Coronavirus 2 by RT PCR: NEGATIVE

## 2021-10-26 LAB — URINALYSIS, ROUTINE W REFLEX MICROSCOPIC
Bilirubin Urine: NEGATIVE
Glucose, UA: >1000 mg/dL — AB
Hgb urine dipstick: NEGATIVE
Ketones, ur: 15 mg/dL — AB
Leukocytes,Ua: NEGATIVE
Nitrite: POSITIVE — AB
Protein, ur: 30 mg/dL — AB
Specific Gravity, Urine: 1.015 (ref 1.005–1.030)
pH: 7 (ref 5.0–8.0)

## 2021-10-26 LAB — TROPONIN I (HIGH SENSITIVITY): Troponin I (High Sensitivity): 2 ng/L (ref <18)

## 2021-10-26 LAB — CBG MONITORING, ED: Glucose-Capillary: 176 mg/dL — ABNORMAL HIGH (ref 70–99)

## 2021-10-26 MED ORDER — CEPHALEXIN 250 MG PO CAPS
500.0000 mg | ORAL_CAPSULE | Freq: Once | ORAL | Status: AC
Start: 2021-10-26 — End: 2021-10-26
  Administered 2021-10-26: 500 mg via ORAL
  Filled 2021-10-26: qty 2

## 2021-10-26 MED ORDER — MECLIZINE HCL 25 MG PO TABS
50.0000 mg | ORAL_TABLET | Freq: Once | ORAL | Status: AC
Start: 2021-10-26 — End: 2021-10-26
  Administered 2021-10-26: 50 mg via ORAL
  Filled 2021-10-26: qty 2

## 2021-10-26 MED ORDER — SODIUM CHLORIDE 0.9 % IV BOLUS
1000.0000 mL | Freq: Once | INTRAVENOUS | Status: AC
  Administered 2021-10-26: 1000 mL via INTRAVENOUS

## 2021-10-26 MED ORDER — ALUM & MAG HYDROXIDE-SIMETH 200-200-20 MG/5ML PO SUSP
30.0000 mL | Freq: Once | ORAL | Status: AC
  Administered 2021-10-26: 30 mL via ORAL
  Filled 2021-10-26: qty 30

## 2021-10-26 MED ORDER — MECLIZINE HCL 25 MG PO TABS: 25.0000 mg | ORAL_TABLET | Freq: Three times a day (TID) | ORAL | 0 refills | Status: DC | PRN

## 2021-10-26 MED ORDER — ONDANSETRON HCL 4 MG/2ML IJ SOLN
4.0000 mg | Freq: Once | INTRAMUSCULAR | Status: AC
  Administered 2021-10-26: 4 mg via INTRAVENOUS
  Filled 2021-10-26: qty 2

## 2021-10-26 MED ORDER — HYDRALAZINE HCL 20 MG/ML IJ SOLN
5.0000 mg | Freq: Once | INTRAMUSCULAR | Status: AC
  Administered 2021-10-26: 5 mg via INTRAVENOUS
  Filled 2021-10-26: qty 1

## 2021-10-26 MED ORDER — CEPHALEXIN 500 MG PO CAPS: 500.0000 mg | ORAL_CAPSULE | Freq: Two times a day (BID) | ORAL | 0 refills | Status: AC

## 2021-10-26 NOTE — ED Triage Notes (Signed)
Patient here POV from Home.  Endorses Dizziness this AM that has worsened since it began. Symptoms awakened the Patient at 0700 this AM.   Patient also began to have Lightheadedness and CP in the Waiting Area here. Nausea and Emesis as well. History of Diabetes.   NAD Noted during Triage. A&Ox4. GCS 15. Ambulatory.

## 2021-10-26 NOTE — Telephone Encounter (Signed)
Caller is Martyn Malay, patient's granddaughter, who acted as her interpreter.   Caller states: -Patient got shingles vaccination 2 days ago and has been feeling bad since - She is experiencing dizziness, headache, and eye pain  Caller has been transferred to triage, who was informed she has her own interpreter.

## 2021-10-26 NOTE — Discharge Instructions (Addendum)
I recommend that you increase your losartan dose from 25 mg to 50 mg once per day, for the next 7 days.  Please keep a daily diary with your blood pressure that you check every morning.  Take this back to the primary care doctor's office.  They will decide what further changes to make to your blood pressure medicine.  I also started you on treatment for urine infection with Keflex.  Take this twice a day for 5 days as prescribed.  I prescribed a medicine called meclizine which you can take as needed for her vertigo and dizziness

## 2021-10-26 NOTE — ED Provider Notes (Signed)
[facility] EMERGENCY DEPT Provider Note   CSN: [id number] Arrival date & time: 10/26/21  1427     History  Chief Complaint  Patient presents with   Dizziness    Cindy Guerrero is a 71 y.o. female presenting to the ED with generalized dizziness.  Her daughter at the bedside helps to translate per their preference (they refused translator).  They report that the patient got vaccinated for pneumonia, shingles, and flu, 2 days ago on Wednesday.  Yesterday the patient began having fevers and chills.  She had a very mild cough.  She woke up this morning with vertigo, worse with head movement.  She has never had this before.  She noted her blood pressure was also very high, typically does well controlled at home, she is only on 25 mg losartan, but today it was over 854 systolic.  She began having chest discomfort as well burning and discomfort in her chest.  She vomited a few times which she said was related to the vertigo after arriving in the ED.  She denies headache or blurred vision at this time.  HPI     Home Medications Prior to Admission medications   Medication Sig Start Date End Date Taking? Authorizing Provider  cephALEXin (KEFLEX) 500 MG capsule Take 1 capsule (500 mg total) by mouth 2 (two) times daily for 5 days. 10/26/21 10/31/21 Yes Lonny Eisen, Carola Rhine, MD  meclizine (ANTIVERT) 25 MG tablet Take 1 tablet (25 mg total) by mouth 3 (three) times daily as needed for up to 21 doses for dizziness. 10/26/21  Yes Wyvonnia Dusky, MD  alendronate (FOSAMAX) 70 MG tablet Take 1 tablet (70 mg total) by mouth once a week. Take with a full glass of water on an empty stomach. 10/16/21   Tawnya Crook, MD  Calcium Carb-Cholecalciferol 600-800 MG-UNIT TABS Take by mouth.    [provider]  empagliflozin (JARDIANCE) 10 MG TABS tablet Take 1 tablet (10 mg total) by mouth daily before breakfast. 10/18/21   Tawnya Crook, MD  fenofibrate 160 MG tablet Take 1 tablet (160 mg  total) by mouth daily. 10/18/21   Tawnya Crook, MD  gemfibrozil (LOPID) 600 MG tablet Take 600 mg by mouth 2 (two) times daily before a meal. Patient not taking: Reported on 10/16/2021    [provider]  glucose blood (ONETOUCH VERIO) test strip 1 each by Other route daily. And lancets 1/day.  E11.9 10/17/21   Tawnya Crook, MD  Lancets (ONETOUCH DELICA PLUS [id number]) [place] USE 1  TO CHECK GLUCOSE ONCE DAILY 03/06/20   Renato Shin, MD  Lancets [facility] ULTRASOFT) lancets Use as instructed 10/17/21   Tawnya Crook, MD  losartan (COZAAR) 25 MG tablet Take 1 tablet (25 mg total) by mouth daily. 06/01/21   Renato Shin, MD  metFORMIN (GLUCOPHAGE-XR) 500 MG 24 hr tablet Take 4 tablets (2,000 mg total) by mouth daily. 06/01/21   Renato Shin, MD  pioglitazone (ACTOS) 45 MG tablet Take 1 tablet (45 mg total) by mouth daily. 06/01/21   Renato Shin, MD  Pyridoxine HCl (B-6 PO) Take 1 tablet by mouth daily.    [provider]  rosuvastatin (CRESTOR) 10 MG tablet Take 1 tablet (10 mg total) by mouth daily. 10/18/21   Tawnya Crook, MD  VITAMIN D PO Take 1 capsule by mouth daily.    [provider]  vitamin E 1000 UNIT capsule Take 1,000 Units by mouth daily.    [provider]      Allergies    Atorvastatin calcium    Review of Systems   Review of Systems  Physical Exam Updated Vital Signs BP (!) 144/67   Pulse 83   Temp 98 F (36.7 C)   Resp 18   Ht '4\' 11"'$  (1.499 m)   Wt 48.8 kg   SpO2 98%   BMI 21.73 kg/m  Physical Exam Constitutional:      General: She is not in acute distress. HENT:     Head: Normocephalic and atraumatic.  Eyes:     Conjunctiva/sclera: Conjunctivae normal.     Pupils: Pupils are equal, round, and reactive to light.  Cardiovascular:     Rate and Rhythm: Normal rate and regular rhythm.  Pulmonary:     Effort: Pulmonary effort is normal. No respiratory distress.  Abdominal:     General: There is no distension.      Tenderness: There is no abdominal tenderness.  Skin:    General: Skin is warm and dry.  Neurological:     General: No focal deficit present.     Mental Status: She is alert and oriented to person, place, and time. Mental status is at baseline.     Sensory: No sensory deficit.     Motor: No weakness.  Psychiatric:        Mood and Affect: Mood normal.        Behavior: Behavior normal.     ED Results / Procedures / Treatments   Labs (all labs ordered are listed, but only abnormal results are displayed) Labs Reviewed  BASIC METABOLIC PANEL - Abnormal; Notable for the following components:      Result Value   Glucose, Bld 183 (*)    Creatinine, Ser 1.10 (*)    GFR, Estimated 53 (*)    All other components within normal limits  CBC - Abnormal; Notable for the following components:   Hemoglobin 11.3 (*)    HCT 35.3 (*)    MCH 25.7 (*)    All other components within normal limits  URINALYSIS, ROUTINE W REFLEX MICROSCOPIC - Abnormal; Notable for the following components:   Color, Urine COLORLESS (*)    Glucose, UA >1,000 (*)    Ketones, ur 15 (*)    Protein, ur 30 (*)    Nitrite POSITIVE (*)    Bacteria, UA RARE (*)    All other components within normal limits  CBG MONITORING, ED - Abnormal; Notable for the following components:   Glucose-Capillary 176 (*)    All other components within normal limits  RESP PANEL BY RT-PCR (FLU A&B, COVID) [id number]  URINE CULTURE  TROPONIN I (HIGH SENSITIVITY)    EKG EKG Interpretation  Date/Time:  Friday October 26 2021 14:45:30 EDT Ventricular Rate:  81 PR Interval:  134 QRS Duration: 86 QT Interval:  418 QTC Calculation: 485 R Axis:   66 Text Interpretation: Normal sinus rhythm Nonspecific T wave abnormality When compared with ECG of 23-Oct-2004 00:50, Questionable change in QRS axis Nonspecific T wave abnormality now evident in Lateral leads Confirmed by Octaviano Glow [phone]) on 10/26/2021 3:37:43 PM  Radiology DG Chest 2  View  Result Date: 10/26/2021 CLINICAL DATA:  Chest pain EXAM: CHEST - 2 VIEW COMPARISON:  10/22/2004 FINDINGS: Normal heart size, mediastinal contours, and pulmonary vascularity. Atherosclerotic calcification aorta. Lungs clear. No pulmonary infiltrate, pleural effusion, or pneumothorax. Bones demineralized. IMPRESSION: No acute abnormalities. Aortic Atherosclerosis (ICD10-I70.0). Electronically Signed   By: Crist Infante.D.  On: 10/26/2021 15:21    Procedures Procedures    Medications Ordered in ED Medications  ondansetron (ZOFRAN) injection 4 mg (4 mg Intravenous Given 10/26/21 1605)  hydrALAZINE (APRESOLINE) injection 5 mg (5 mg Intravenous Given 10/26/21 1605)  meclizine (ANTIVERT) tablet 50 mg (50 mg Oral Given 10/26/21 1605)  alum & mag hydroxide-simeth (MAALOX/MYLANTA) 200-200-20 MG/5ML suspension 30 mL (30 mLs Oral Given 10/26/21 1605)  sodium chloride 0.9 % bolus 1,000 mL (0 mLs Intravenous Stopped 10/26/21 1703)  cephALEXin (KEFLEX) capsule 500 mg (500 mg Oral Given 10/26/21 1815)    ED Course/ Medical Decision Making/ A&P Clinical Course as of 10/26/21 1846  Fri Oct 26, 2021  1843 Patient is feeling significantly better is able to ambulate steadily to the bathroom.  I will prescribe her the meclizine for her vertigo, Keflex for UTI, also recommend increasing her losartan for the next few days.  She will keep a daily diary of her blood pressures.  We discussed a low-salt diet.  She and her daughter verbalized understanding [MT]    Clinical Course User Index [MT] Samari Bittinger, Carola Rhine, MD                           Medical Decision Making Amount and/or Complexity of Data Reviewed Labs: ordered. Radiology: ordered.  Risk OTC drugs. Prescription drug management.   This patient presents to the ED with concern for vertigo, fevers, chest discomfort. This involves an extensive number of treatment options, and is a complaint that carries with it a high risk of complications and  morbidity.  The differential diagnosis includes side effect from 3 vaccinations most likely (this was her first time getting the shingles shot) versus other viral infection including COVID-19, versus pneumonia, versus peripheral vertigo versus other  This constellation of symptoms is not consistent with a stroke or posterior infarct.  She does have elevated BP I suspect is related to her discomfort, not the cause of her symptoms.  Less likely to be PRES at this time.  Her nausea and chest discomfort and cough are more suggestive of a viral illness.  I do not see an indication for emergent MRI imaging of the brain.  Chest pain is also quite atypical for ACS.  I have a low suspicion for aortic dissection or PE or ACS at this time, and do not see an indication for emergent CT imaging of the chest.  Additional history obtained from family at the bedside   I ordered and personally interpreted labs.  The pertinent results include: No leukocytosis, no acute anemia, BMP with some mild hyperglycemia otherwise at baseline  I ordered imaging studies including x-ray of the chest I independently visualized and interpreted imaging which showed no focal infiltrate or abnormal findings I agree with the radiologist interpretation  The patient was maintained on a cardiac monitor.  I personally viewed and interpreted the cardiac monitored which showed an underlying rhythm of: Normal sinus rhythm with no arrhythmia  Per my interpretation the patient's ECG shows normal sinus rhythm no acute ischemic findings  I ordered medication including hydralazine for hypertension, meclizine for suspected peripheral vertigo, Zofran for nausea.  I have reviewed the patients home medicines and have made adjustments as needed -as they are able to monitor and check her blood pressure daily at home I recommended increase her losartan dose from 25 mg to 50 mg a follow-up with her doctor for this issue.  This elevated blood  pressure may however  be transient, per their clinical history, and may be related to recurrent illness.  After the interventions noted above, I reevaluated the patient and found that they have: improved  Dispostion:  After consideration of the diagnostic results and the patients response to treatment, I feel that the patent would benefit from outpatient follow-up.         Final Clinical Impression(s) / ED Diagnoses Final diagnoses:  Urinary tract infection without hematuria, site unspecified  Vertigo  Hypertension, unspecified type    Rx / DC Orders ED Discharge Orders          Ordered    meclizine (ANTIVERT) 25 MG tablet  3 times daily PRN        10/26/21 1846    cephALEXin (KEFLEX) 500 MG capsule  2 times daily        10/26/21 1846              Larance Ratledge, Carola Rhine, MD 10/26/21 [phone]

## 2021-10-26 NOTE — Telephone Encounter (Signed)
Patient went to ED

## 2021-10-26 NOTE — Telephone Encounter (Signed)
Patient Name: Cindy [facility] Gender: Female DOB: [birth year] Age: 71 Y 64 M 13 D Return Phone Number: [phone] (Primary), [phone] (Secondary) Address: City/ State/ Zip: [place]  [address] Client [facility] [place] [facility] [place] [facility] Contact Type Call Who Is Calling Patient / Member / Family / Caregiver Call Type Triage / Clinical Caller Name Benjamine Mola Relationship To Patient Grandchild Return Phone Number [phone] (Secondary) Chief Complaint Dizziness Reason for Call Symptomatic / Request for [place] states a pt had the shingles vaccination two days ago, she is not feeling well she has a headache, dizziness, and her eyes hurt. Additional Comment Granddaughter will be tramslating for the patient. Translation No Nurse Assessment Nurse: Zenia Resides, RN, Diane Date/Time Eilene Ghazi Time): 10/26/2021 1:37:22 PM Confirm and document reason for call. If symptomatic, describe symptoms. ---Caller states a pt had the shingles and flu vaccination two days ago, she is not feeling well she has a headache, dizziness, and her eyes hurt/burning. Seeing spots and having double vision. Symptoms started yesterday. No fever. Does the patient have any new or worsening symptoms? ---Yes Will a triage be completed? ---Yes Related visit to physician within the last 2 weeks? ---No Does the PT have any chronic conditions? (i.e. diabetes, asthma, this includes High risk factors for pregnancy, etc.) ---Yes List chronic conditions. ---DM, high cholesterol Is this a behavioral health or substance abuse call? ---No Guidelines Guideline Title Affirmed Question Affirmed Notes Nurse Date/Time (Eastern Time) Dizziness - Lightheadedness SEVERE dizziness (e.g., unable to stand, requires support Zenia Resides, Therapist, sports, Diane 10/26/2021 1:39:08 PM  Guidelines Guideline Title Affirmed Question Affirmed Notes  Nurse Date/Time (Eastern Time) to walk, feels like passing out now) Disp. Time Eilene Ghazi Time) Disposition Final User 10/26/2021 1:41:23 PM Go to ED Now (or PCP triage) Yes Zenia Resides, RN, Diane Final Disposition 10/26/2021 1:41:23 PM Go to ED Now (or PCP triage) Yes Zenia Resides, RN, Diane Caller Disagree/Comply Comply Caller Understands Yes PreDisposition Did not know what to do Care Advice Given Per Guideline GO TO ED NOW (OR PCP TRIAGE): * IF NO PCP (PRIMARY CARE PROVIDER) SECOND-LEVEL TRIAGE: You need to be seen within the next hour. Go to the [facility] at _____________ [place] as soon as you can. ANOTHER ADULT SHOULD DRIVE: * It is better and safer if another adult drives instead of you. CARE ADVICE given per Dizziness (Adult) guideline. Referrals [facility] - ED

## 2021-10-29 LAB — URINE CULTURE: Culture: >=100000 — AB

## 2021-10-30 ENCOUNTER — Telehealth: Payer: Self-pay

## 2021-10-30 NOTE — Telephone Encounter (Signed)
Post ED Visit - Positive Culture Follow-up  Culture report reviewed by antimicrobial stewardship pharmacist: [place] Team '[x]'$  Esmeralda Arthur, Pharm.D. '[]'$  Heide Guile, Pharm.D., BCPS AQ-ID '[]'$  Parks Neptune, Pharm.D., BCPS '[]'$  Alycia Rossetti, Pharm.D., BCPS '[]'$  Winner, Pharm.D., BCPS, AAHIVP '[]'$  Legrand Como, Pharm.D., BCPS, AAHIVP '[]'$  Salome Arnt, PharmD, BCPS '[]'$  Johnnette Gourd, PharmD, BCPS '[]'$  Hughes Better, PharmD, BCPS '[]'$  Leeroy Cha, PharmD '[]'$  Laqueta Linden, PharmD, BCPS '[]'$  Albertina Parr, PharmD  [place] Team '[]'$  Leodis Sias, PharmD '[]'$  Lindell Spar, PharmD '[]'$  Royetta Asal, PharmD '[]'$  Graylin Shiver, Rph '[]'$  Rema Fendt) Cindy Mac, PharmD '[]'$  Arlyn Dunning, PharmD '[]'$  Netta Cedars, PharmD '[]'$  Dia Sitter, PharmD '[]'$  Leone Haven, PharmD '[]'$  Gretta Arab, PharmD '[]'$  Theodis Shove, PharmD '[]'$  Peggyann Juba, PharmD '[]'$  Reuel Boom, PharmD   Positive urine culture Treated with Cephalexin, organism sensitive to the same and no further patient follow-up is required at this time.  Cindy Guerrero 10/30/2021, 11:14 AM

## 2021-11-21 ENCOUNTER — Ambulatory Visit (INDEPENDENT_AMBULATORY_CARE_PROVIDER_SITE_OTHER): Payer: Medicare Other | Admitting: Internal Medicine

## 2021-11-21 ENCOUNTER — Encounter: Payer: Self-pay | Admitting: Internal Medicine

## 2021-11-21 VITALS — BP 120/76 | HR 59 | Ht 59.0 in | Wt 105.8 lb

## 2021-11-21 DIAGNOSIS — E1142 Type 2 diabetes mellitus with diabetic polyneuropathy: Secondary | ICD-10-CM

## 2021-11-21 DIAGNOSIS — E785 Hyperlipidemia, unspecified: Secondary | ICD-10-CM | POA: Diagnosis not present

## 2021-11-21 MED ORDER — ONETOUCH DELICA PLUS LANCET33G MISC
3 refills | Status: DC
Start: 2021-11-21 — End: 2022-10-14

## 2021-11-21 MED ORDER — EMPAGLIFLOZIN 10 MG PO TABS: 10.0000 mg | ORAL_TABLET | Freq: Every day | ORAL | 3 refills | Status: DC

## 2021-11-21 MED ORDER — ONETOUCH VERIO W/DEVICE KIT: PACK | 1 refills | Status: DC

## 2021-11-21 NOTE — Progress Notes (Signed)
Patient ID: Cindy Guerrero, female   DOB: Aug 22, 1950, 71 y.o.   MRN: [id number]  HPI: Cindy Guerrero is a 71 y.o.-year-old female, returning for follow-up for DM2, dx in 1998, non-insulin-dependent, uncontrolled, with complications (PN). Pt. previously saw Dr. Loanne Drilling, last visit 6 mo ago. She is here with her granddaughter who translates for [place] and offers information about her past medical history, diabetic regimen, and medications.  Reviewed HbA1c: Lab Results  Component Value Date   HGBA1C 8.0 (H) 10/16/2021   HGBA1C 8.0 (A) 06/01/2021   HGBA1C 6.0 (A) 06/02/2020   HGBA1C 6.1 (A) 12/03/2019   HGBA1C 6.3 (A) 05/13/2019   HGBA1C 6.2 (A) 01/13/2019   HGBA1C 6.4 (A) 09/25/2018   HGBA1C 6.4 (A) 01/08/2018   HGBA1C 6.6 (A) 09/19/2017   HGBA1C 8.1 07/10/2017   Pt is on a regimen of: - Metformin ER 1000 mg 2x a day, with meals >> AP in the evening - Actos 45 mg before breakfast - Jardiance 10 mg before breakfast - started 10/2021 by PCP  Pt does not check her sugars - no lancets. - am: n/c - 2h after b'fast: n/c - before lunch: n/c - 2h after lunch: n/c - before dinner: n/c - 2h after dinner: n/c - bedtime: n/c - nighttime: n/c Lowest sugar was  ?;. Highest sugar was 130.  Glucometer: One Probation officer  She is cutting back on carbs: rice, sweets.  - no h/o CKD, last BUN/creatinine:  Lab Results  Component Value Date   BUN 20 10/26/2021   BUN 17 10/16/2021   CREATININE 1.10 (H) 10/26/2021   CREATININE 0.94 10/16/2021   Lab Results  Component Value Date   MICRALBCREAT 24.7 10/16/2021  On losartan 25 mg daily.  -HL; last set of lipids: Lab Results  Component Value Date   CHOL 265 (H) 10/16/2021   HDL 53.50 10/16/2021   LDLDIRECT 136.0 10/16/2021   TRIG (H) 10/16/2021    528.0 Triglyceride is over 400; calculations on Lipids are invalid.   CHOLHDL 5 10/16/2021  She was started on Crestor 10 mg daily and fenofibrate 160 mg daily.  - last eye exam was in 2016. No DR  reportedly. She has a history of cataract surgery.  - + numbness and tingling in her feet. + occasional burning on soles - relieved by walking.  Last foot exam 06/2020.  She also has a history of osteoporosis, managed by PCP.  She is on Fosamax 70 mg weekly.  ROS: + see HPI No increased urination, blurry vision, nausea, chest pain. She has leg pain.  Past Medical History:  Diagnosis Date   Diabetes ([place])    Dyslipidemia    Headache    Osteoporosis    Past Surgical History:  Procedure Laterality Date   EYE SURGERY     cataract surgery   Social History   Socioeconomic History   Marital status: Significant Other    Spouse name: Not on file   Number of children: 6   Years of education: Not on file   Highest education level: Not on file  Occupational History   Not on file  Tobacco Use   Smoking status: Never   Smokeless tobacco: Never  Vaping Use   Vaping Use: Never used  Substance and Sexual Activity   Alcohol use: No   Drug use: Never   Sexual activity: Not Currently  Other Topics Concern   Not on file  Social History Narrative   Retired Estate manager/land agent   12 grands  9 greats   Social Determinants of Radio broadcast assistant Strain: Not on file  Food Insecurity: Not on file  Transportation Needs: Not on file  Physical Activity: Not on file  Stress: Not on file  Social Connections: Not on file  Intimate Partner Violence: Not on file   Current Outpatient Medications on File Prior to Visit  Medication Sig Dispense Refill   alendronate (FOSAMAX) 70 MG tablet Take 1 tablet (70 mg total) by mouth once a week. Take with a full glass of water on an empty stomach. 12 tablet 1   Calcium Carb-Cholecalciferol 600-800 MG-UNIT TABS Take by mouth.     empagliflozin (JARDIANCE) 10 MG TABS tablet Take 1 tablet (10 mg total) by mouth daily before breakfast. 30 tablet 3   fenofibrate 160 MG tablet Take 1 tablet (160 mg total) by mouth daily. 30 tablet 3   gemfibrozil (LOPID)  600 MG tablet Take 600 mg by mouth 2 (two) times daily before a meal. (Patient not taking: Reported on 10/16/2021)     glucose blood (ONETOUCH VERIO) test strip 1 each by Other route daily. And lancets 1/day.  E11.9 100 each 3   Lancets (ONETOUCH DELICA PLUS [id number]) MISC USE 1  TO CHECK GLUCOSE ONCE DAILY 100 each 5   Lancets (ONETOUCH ULTRASOFT) lancets Use as instructed 100 each 12   losartan (COZAAR) 25 MG tablet Take 1 tablet (25 mg total) by mouth daily. 90 tablet 3   meclizine (ANTIVERT) 25 MG tablet Take 1 tablet (25 mg total) by mouth 3 (three) times daily as needed for up to 21 doses for dizziness. 21 tablet 0   metFORMIN (GLUCOPHAGE-XR) 500 MG 24 hr tablet Take 4 tablets (2,000 mg total) by mouth daily. 360 tablet 3   pioglitazone (ACTOS) 45 MG tablet Take 1 tablet (45 mg total) by mouth daily. 90 tablet 3   Pyridoxine HCl (B-6 PO) Take 1 tablet by mouth daily.     rosuvastatin (CRESTOR) 10 MG tablet Take 1 tablet (10 mg total) by mouth daily. 90 tablet 3   VITAMIN D PO Take 1 capsule by mouth daily.     vitamin E 1000 UNIT capsule Take 1,000 Units by mouth daily.     No current facility-administered medications on file prior to visit.   Allergies  Allergen Reactions   Atorvastatin Calcium     Other reaction(s): cough?   Family History  Problem Relation Age of Onset   Diabetes Mother    Hyperlipidemia Daughter    Diabetes Daughter    Asthma Daughter    PE: BP 120/76 (BP Location: Left Arm, Patient Position: Sitting, Cuff Size: Normal)   Pulse (!) 59   Ht '4\' 11"'$  (1.499 m)   Wt 105 lb 12.8 oz (48 kg)   SpO2 97%   BMI 21.37 kg/m  Wt Readings from Last 3 Encounters:  11/21/21 105 lb 12.8 oz (48 kg)  10/26/21 107 lb 9.4 oz (48.8 kg)  10/16/21 107 lb 8 oz (48.8 kg)   Constitutional: normal weight, in NAD Eyes: no exophthalmos ENT: moist mucous membranes, no thyromegaly, no cervical lymphadenopathy Cardiovascular: RRR, No MRG Respiratory: CTA B Musculoskeletal: no  deformities Skin: moist, warm, no rashes Neurological: no tremor with outstretched hands  ASSESSMENT: 1. DM2, non-insulin-dependent, uncontrolled, with complications - Peripheral neuropathy  2.  Hyperlipidemia  PLAN:  1. Patient with long-standing, uncontrolled diabetes, on oral antidiabetic regimen with metformin, TZD and now also SGLT2 inhibitor, added after the latest HbA1c returned  still elevated, at 8% last month.  Unfortunately, at this time, patient is not checking her blood sugars as she did not have lancets.  I called these in to her pharmacy along with another One Touch Verio meter.  She does have test strips. -For now, it is impossible to change her regimen without more information about her blood sugars.  The only change that I suggested was to decrease the dose of metformin in the evening, since she does complain of abdominal discomfort/pain after taking 2 tablets at dinnertime. - I suggested to:  Patient Instructions  Please change: - Metformin ER 1000 mg in am and 500 mg with dinner  Continue: - Actos 45 mg before breakfast - Jardiance 10 mg before breakfast  Start checking sugars 1-2x a day.  Please return for another visit in 3-4 months.  - check sugars at different times of the day - check 1-2x a day, rotating checks - discussed about CBG targets for treatment: 80-130 mg/dL before meals and <180 mg/dL after meals; target HbA1c <7%. - given sugar log and advised how to fill it and to bring it at next appt  - given foot care handout  - given instructions for hypoglycemia management "15-15 rule"  - advised for yearly eye exams >> she is overdue, but she could not schedule this until now as she did not have a primary care physician in the referral - Return to clinic in 3-4 mo with sugar log   2.  Hyperlipidemia - Reviewed latest lipid panel from last month: Triglycerides very high, LDL also above goal: Lab Results  Component Value Date   CHOL 265 (H) 10/16/2021    HDL 53.50 10/16/2021   LDLDIRECT 136.0 10/16/2021   TRIG (H) 10/16/2021    528.0 Triglyceride is over 400; calculations on Lipids are invalid.   CHOLHDL 5 10/16/2021  -After the above results returned, she was started on Crestor 10 mg daily and she is also on fenofibrate 160 mg daily.  No side effects.  Philemon Kingdom, MD PhD [facility] Endocrinology

## 2021-11-21 NOTE — Patient Instructions (Addendum)
Please change: - Metformin ER 1000 mg in am and 500 mg with dinner  Continue: - Actos 45 mg before breakfast - Jardiance 10 mg before breakfast  Start checking sugars 1-2x a day.  Please return for another visit in 3-4 months.  PATIENT INSTRUCTIONS FOR TYPE 2 DIABETES:  **Please join MyChart!** - see attached instructions about how to join if you have not done so already.  DIET AND EXERCISE Diet and exercise is an important part of diabetic treatment.  We recommended aerobic exercise in the form of brisk walking (working between 40-60% of maximal aerobic capacity, similar to brisk walking) for 150 minutes per week (such as 30 minutes five days per week) along with 3 times per week performing 'resistance' training (using various gauge rubber tubes with handles) 5-10 exercises involving the major muscle groups (upper body, lower body and core) performing 10-15 repetitions (or near fatigue) each exercise. Start at half the above goal but build slowly to reach the above goals. If limited by weight, joint pain, or disability, we recommend daily walking in a swimming pool with water up to waist to reduce pressure from joints while allow for adequate exercise.    BLOOD GLUCOSES Monitoring your blood glucoses is important for continued management of your diabetes. Please check your blood glucoses 2-4 times a day: fasting, before meals and at bedtime (you can rotate these measurements - e.g. one day check before the 3 meals, the next day check before 2 of the meals and before bedtime, etc.).   HYPOGLYCEMIA (low blood sugar) Hypoglycemia is usually a reaction to not eating, exercising, or taking too much insulin/ other diabetes drugs.  Symptoms include tremors, sweating, hunger, confusion, headache, etc. Treat IMMEDIATELY with 15 grams of Carbs: 4 glucose tablets  cup regular juice/soda 2 tablespoons raisins 4 teaspoons sugar 1 tablespoon honey Recheck blood glucose in 15 mins and repeat above  if still symptomatic/blood glucose <100.  RECOMMENDATIONS TO REDUCE YOUR RISK OF DIABETIC COMPLICATIONS: * Take your prescribed MEDICATION(S) * Follow a DIABETIC diet: Complex carbs, fiber rich foods, (monounsaturated and polyunsaturated) fats * AVOID saturated/trans fats, high fat foods, >2,300 mg salt per day. * EXERCISE at least 5 times a week for 30 minutes or preferably daily.  * DO NOT SMOKE OR DRINK more than 1 drink a day. * Check your FEET every day. Do not wear tightfitting shoes. Contact us if you develop an ulcer * See your EYE doctor once a year or more if needed * Get a FLU shot once a year * Get a PNEUMONIA vaccine once before and once after age 54 years  GOALS:  * Your Hemoglobin A1c of <7%  * fasting sugars need to be 80-130 * after meals sugars need to be <180 (2h after you start eating) * Your Systolic BP should be 725 or lower  * Your Diastolic BP should be 80 or lower  * Your HDL (Good Cholesterol) should be 40 or higher  * Your LDL (Bad Cholesterol) should be ideally <70. * Your Triglycerides should be 150 or lower  * Your Urine microalbumin (kidney function) should be <30 * Your Body Mass Index should be 25 or lower   Please consider the following ways to cut down carbs and fat and increase fiber and micronutrients in your diet: - substitute whole grain for white bread or pasta - substitute brown rice for white rice - substitute 90-calorie flat bread pieces for slices of bread when possible - substitute sweet potatoes or  yams for white potatoes - substitute humus for margarine - substitute tofu for cheese when possible - substitute almond or rice milk for regular milk (would not drink soy milk daily due to concern for soy estrogen influence on breast cancer risk) - substitute dark chocolate for other sweets when possible - substitute water - can add lemon or orange slices for taste - for diet sodas (artificial sweeteners will trick your body that you can eat  sweets without getting calories and will lead you to overeating and weight gain in the long run) - do not skip breakfast or other meals (this will slow down the metabolism and will result in more weight gain over time)  - can try smoothies made from fruit and almond/rice milk in am instead of regular breakfast - can also try old-fashioned (not instant) oatmeal made with almond/rice milk in am - order the dressing on the side when eating salad at a restaurant (pour less than half of the dressing on the salad) - eat as little meat as possible - can try juicing, but should not forget that juicing will get rid of the fiber, so would alternate with eating raw veg./fruits or drinking smoothies - use as little oil as possible, even when using olive oil - can dress a salad with a mix of balsamic vinegar and lemon juice, for e.g. - use agave nectar, stevia sugar, or regular sugar rather than artificial sweateners - steam or broil/roast veggies  - snack on veggies/fruit/nuts (unsalted, preferably) when possible, rather than processed foods - reduce or eliminate aspartame in diet (it is in diet sodas, chewing gum, etc) Read the labels!  Try to read Dr. Janene Harvey book: "Program for Reversing Diabetes" for other ideas for healthy eating.

## 2021-11-22 ENCOUNTER — Telehealth: Payer: Self-pay | Admitting: Family Medicine

## 2021-11-22 NOTE — Telephone Encounter (Signed)
Caller states: -Patient visited Dr. Cruzita Lederer, endocrinology on 09/20 and was told she would need to have her eyes checked by ophthalmology   Caller requests: -A referral for patient to ophthalmology as recommended

## 2021-11-26 ENCOUNTER — Ambulatory Visit (HOSPITAL_BASED_OUTPATIENT_CLINIC_OR_DEPARTMENT_OTHER)
Admission: RE | Admit: 2021-11-26 | Discharge: 2021-11-26 | Disposition: A | Discharge status: Home / Self Care | Payer: Medicare Other | Source: Ambulatory Visit | Attending: Family Medicine | Admitting: Family Medicine

## 2021-11-26 DIAGNOSIS — M816 Localized osteoporosis [Lequesne]: Secondary | ICD-10-CM | POA: Diagnosis not present

## 2021-11-26 DIAGNOSIS — M810 Age-related osteoporosis without current pathological fracture: Secondary | ICD-10-CM | POA: Diagnosis not present

## 2021-11-26 LAB — HM DEXA SCAN

## 2021-11-27 ENCOUNTER — Other Ambulatory Visit: Payer: Self-pay | Admitting: *Deleted

## 2021-11-27 ENCOUNTER — Encounter: Payer: Self-pay | Admitting: Family Medicine

## 2021-11-27 DIAGNOSIS — Z0100 Encounter for examination of eyes and vision without abnormal findings: Secondary | ICD-10-CM

## 2021-11-29 ENCOUNTER — Telehealth: Payer: Self-pay | Admitting: Family Medicine

## 2021-11-29 NOTE — Telephone Encounter (Signed)
Caller States: -pt received a call 09/28 from [facility] phone number.  [place] office could not find documentation.  Caller Requests: -Call back any time as needed.

## 2022-01-01 ENCOUNTER — Ambulatory Visit: Payer: Medicare Other | Admitting: Internal Medicine

## 2022-03-18 ENCOUNTER — Ambulatory Visit: Payer: 59 | Admitting: Internal Medicine

## 2022-03-18 NOTE — Progress Notes (Deleted)
Patient ID: Cindy Guerrero, female   DOB: 19-Sep-1950, 72 y.o.   MRN: [id number]  HPI: Cindy Guerrero is a 72 y.o.-year-old female, returning for follow-up for DM2, dx in 1998, non-insulin-dependent, uncontrolled, with complications ([place]). Pt. previously saw Dr. Loanne Drilling, but last visit with me 4 months ago. She is here with her granddaughter who translates for [place] and offers information about her past medical history, diabetic regimen, and medications.  Reviewed HbA1c: Lab Results  Component Value Date   HGBA1C 8.0 (H) 10/16/2021   HGBA1C 8.0 (A) 06/01/2021   HGBA1C 6.0 (A) 06/02/2020   HGBA1C 6.1 (A) 12/03/2019   HGBA1C 6.3 (A) 05/13/2019   HGBA1C 6.2 (A) 01/13/2019   HGBA1C 6.4 (A) 09/25/2018   HGBA1C 6.4 (A) 01/08/2018   HGBA1C 6.6 (A) 09/19/2017   HGBA1C 8.1 07/10/2017   Pt is on a regimen of: - Metformin ER 1000 mg 2x a day, with meals >> AP in the evening >> 1000 mg in a.m. and 500 mg with dinner - Actos 45 mg before breakfast - Jardiance 10 mg before breakfast - started 10/2021 by PCP  She was not checking sugars at last visit.  Now checking once a day: - am: n/c - 2h after b'fast: n/c - before lunch: n/c - 2h after lunch: n/c - before dinner: n/c - 2h after dinner: n/c - bedtime: n/c - nighttime: n/c Lowest sugar was  ?;. Highest sugar was 130.  Glucometer: One Probation officer  She is cutting back on carbs: rice, sweets.  - no h/o CKD, last BUN/creatinine:  Lab Results  Component Value Date   BUN 20 10/26/2021   BUN 17 10/16/2021   CREATININE 1.10 (H) 10/26/2021   CREATININE 0.94 10/16/2021   Lab Results  Component Value Date   MICRALBCREAT 24.7 10/16/2021  On losartan 25 mg daily.  -HL; last set of lipids: Lab Results  Component Value Date   CHOL 265 (H) 10/16/2021   HDL 53.50 10/16/2021   LDLDIRECT 136.0 10/16/2021   TRIG (H) 10/16/2021    528.0 Triglyceride is over 400; calculations on Lipids are invalid.   CHOLHDL 5 10/16/2021  She was started on Crestor 10 mg  daily (after the above results returned) and fenofibrate 160 mg daily.  - last eye exam was in 2016. No DR reportedly. She has a history of cataract surgery.  - + numbness and tingling in her feet. + occasional burning on soles - relieved by walking.  Last foot exam 11/21/2021.  She also has a history of osteoporosis, managed by PCP.  She is on Fosamax 70 mg weekly.  ROS: + see HPI  Past Medical History:  Diagnosis Date   Diabetes ([place])    Dyslipidemia    Headache    Osteoporosis    Past Surgical History:  Procedure Laterality Date   EYE SURGERY     cataract surgery   Social History   Socioeconomic History   Marital status: Significant Other    Spouse name: Not on file   Number of children: 6   Years of education: Not on file   Highest education level: Not on file  Occupational History   Not on file  Tobacco Use   Smoking status: Never   Smokeless tobacco: Never  Vaping Use   Vaping Use: Never used  Substance and Sexual Activity   Alcohol use: No   Drug use: Never   Sexual activity: Not Currently  Other Topics Concern   Not on file  Social History  Narrative   Retired Estate manager/land agent   12 grands   9 greats   Social Determinants of Radio broadcast assistant Strain: Not on file  Food Insecurity: Not on file  Transportation Needs: Not on file  Physical Activity: Not on file  Stress: Not on file  Social Connections: Not on file  Intimate Partner Violence: Not on file   Current Outpatient Medications on File Prior to Visit  Medication Sig Dispense Refill   alendronate (FOSAMAX) 70 MG tablet Take 1 tablet (70 mg total) by mouth once a week. Take with a full glass of water on an empty stomach. 12 tablet 1   Blood Glucose Monitoring Suppl (ONETOUCH VERIO) w/Device KIT Use as advsed 1 kit 1   Calcium Carb-Cholecalciferol 600-800 MG-UNIT TABS Take by mouth.     empagliflozin (JARDIANCE) 10 MG TABS tablet Take 1 tablet (10 mg total) by mouth daily before breakfast.  90 tablet 3   fenofibrate 160 MG tablet Take 1 tablet (160 mg total) by mouth daily. 30 tablet 3   gemfibrozil (LOPID) 600 MG tablet Take 600 mg by mouth 2 (two) times daily before a meal. (Patient not taking: Reported on 10/16/2021)     glucose blood (ONETOUCH VERIO) test strip 1 each by Other route daily. And lancets 1/day.  E11.9 100 each 3   Lancets (ONETOUCH DELICA PLUS 123XX123) MISC USE TO CHECK GLUCOSE twice DAILY 200 each 3   losartan (COZAAR) 25 MG tablet Take 1 tablet (25 mg total) by mouth daily. 90 tablet 3   meclizine (ANTIVERT) 25 MG tablet Take 1 tablet (25 mg total) by mouth 3 (three) times daily as needed for up to 21 doses for dizziness. 21 tablet 0   metFORMIN (GLUCOPHAGE-XR) 500 MG 24 hr tablet Take 4 tablets (2,000 mg total) by mouth daily. 360 tablet 3   pioglitazone (ACTOS) 45 MG tablet Take 1 tablet (45 mg total) by mouth daily. 90 tablet 3   Pyridoxine HCl (B-6 PO) Take 1 tablet by mouth daily.     rosuvastatin (CRESTOR) 10 MG tablet Take 1 tablet (10 mg total) by mouth daily. 90 tablet 3   VITAMIN D PO Take 1 capsule by mouth daily.     vitamin E 1000 UNIT capsule Take 1,000 Units by mouth daily.     No current facility-administered medications on file prior to visit.   Allergies  Allergen Reactions   Atorvastatin Calcium     Other reaction(s): cough?   Family History  Problem Relation Age of Onset   Diabetes Mother    Hyperlipidemia Daughter    Diabetes Daughter    Asthma Daughter    PE: There were no vitals taken for this visit. Wt Readings from Last 3 Encounters:  11/21/21 105 lb 12.8 oz (48 kg)  10/26/21 107 lb 9.4 oz (48.8 kg)  10/16/21 107 lb 8 oz (48.8 kg)   Constitutional: normal weight, in NAD Eyes: no exophthalmos ENT:  no thyromegaly, no cervical lymphadenopathy Cardiovascular: RRR, No MRG Respiratory: CTA B Musculoskeletal: no deformities Skin: no rashes Neurological: no tremor with outstretched hands  ASSESSMENT: 1. DM2,  non-insulin-dependent, uncontrolled, with complications - Peripheral neuropathy  2.  Hyperlipidemia  PLAN:  1. Patient with longstanding, uncontrolled, type 2 diabetes, on oral antidiabetic regimen with metformin, TZD, and SGLT2 inhibitor added before last visit.  She was not checking sugars at our last visit and I called a Rx for a One Touch Verio meter + supplies to her pharmacy.  We discussed about trying to check 1-2 times a day.  We did not make drastic changes in her regimen at that time, despite the fact that HbA1c just prior to the visit was still high, at 8%.  I did suggest to decrease the dose of metformin at night as she did complain of abdominal discomfort after taking 2 tablets at dinnertime.  - I suggested to:  Patient Instructions  Please continue: - Metformin ER 1000 mg in am and 500 mg with dinner - Actos 45 mg before breakfast - Jardiance 10 mg before breakfast  Check sugars 1-2x a day.  Please return for another visit in 3-4 months.  - we checked her HbA1c: 7%  - advised to check sugars at different times of the day - 1x a day, rotating check times - advised for yearly eye exams >> she is UTD - return to clinic in 3-4 months  2.  Hyperlipidemia -Reviewed the latest lipid panel from 10/2021: Triglycerides very high and LDL also above goal: Lab Results  Component Value Date   CHOL 265 (H) 10/16/2021   HDL 53.50 10/16/2021   LDLDIRECT 136.0 10/16/2021   TRIG (H) 10/16/2021    528.0 Triglyceride is over 400; calculations on Lipids are invalid.   CHOLHDL 5 10/16/2021  -She was started on Crestor 10 mg daily after the above results returned.  She also continues fenofibrate 160 mg daily.  No side effects.  Philemon Kingdom, MD PhD [facility] Endocrinology

## 2022-04-22 ENCOUNTER — Ambulatory Visit: Payer: Medicare Other | Admitting: Family Medicine

## 2022-04-26 ENCOUNTER — Encounter: Payer: Self-pay | Admitting: Internal Medicine

## 2022-04-26 ENCOUNTER — Ambulatory Visit (INDEPENDENT_AMBULATORY_CARE_PROVIDER_SITE_OTHER): Payer: 59 | Admitting: Family Medicine

## 2022-04-26 ENCOUNTER — Encounter: Payer: Self-pay | Admitting: Family Medicine

## 2022-04-26 VITALS — BP 124/60 | HR 76 | Temp 97.8°F | Ht 59.0 in | Wt 100.4 lb

## 2022-04-26 DIAGNOSIS — R053 Chronic cough: Secondary | ICD-10-CM

## 2022-04-26 DIAGNOSIS — Z961 Presence of intraocular lens: Secondary | ICD-10-CM | POA: Diagnosis not present

## 2022-04-26 DIAGNOSIS — E785 Hyperlipidemia, unspecified: Secondary | ICD-10-CM | POA: Diagnosis not present

## 2022-04-26 DIAGNOSIS — R12 Heartburn: Secondary | ICD-10-CM | POA: Diagnosis not present

## 2022-04-26 DIAGNOSIS — E1165 Type 2 diabetes mellitus with hyperglycemia: Secondary | ICD-10-CM | POA: Diagnosis not present

## 2022-04-26 DIAGNOSIS — E113293 Type 2 diabetes mellitus with mild nonproliferative diabetic retinopathy without macular edema, bilateral: Secondary | ICD-10-CM | POA: Diagnosis not present

## 2022-04-26 DIAGNOSIS — E1169 Type 2 diabetes mellitus with other specified complication: Secondary | ICD-10-CM

## 2022-04-26 DIAGNOSIS — M816 Localized osteoporosis [Lequesne]: Secondary | ICD-10-CM

## 2022-04-26 LAB — TSH: TSH: 3.65 u[IU]/mL (ref 0.35–5.50)

## 2022-04-26 LAB — CBC WITH DIFFERENTIAL/PLATELET
Basophils Absolute: 0.1 10*3/uL (ref 0.0–0.1)
Basophils Relative: 0.9 % (ref 0.0–3.0)
Eosinophils Absolute: 0.2 10*3/uL (ref 0.0–0.7)
Eosinophils Relative: 2.3 % (ref 0.0–5.0)
HCT: 38.1 % (ref 36.0–46.0)
Hemoglobin: 12.5 g/dL (ref 12.0–15.0)
Lymphocytes Relative: 42.2 % (ref 12.0–46.0)
Lymphs Abs: 3.1 10*3/uL (ref 0.7–4.0)
MCHC: 32.7 g/dL (ref 30.0–36.0)
MCV: 78.1 fl (ref 78.0–100.0)
Monocytes Absolute: 0.4 10*3/uL (ref 0.1–1.0)
Monocytes Relative: 5 % (ref 3.0–12.0)
Neutro Abs: 3.6 10*3/uL (ref 1.4–7.7)
Neutrophils Relative %: 49.6 % (ref 43.0–77.0)
Platelets: 279 10*3/uL (ref 150.0–400.0)
RBC: 4.88 Mil/uL (ref 3.87–5.11)
RDW: 13.7 % (ref 11.5–15.5)
WBC: 7.3 10*3/uL (ref 4.0–10.5)

## 2022-04-26 LAB — LIPID PANEL
Cholesterol: 136 mg/dL (ref 0–200)
HDL: 48.6 mg/dL (ref >39.00)
NonHDL: 87.27
Total CHOL/HDL Ratio: 3
Triglycerides: 232 mg/dL — ABNORMAL HIGH (ref 0.0–149.0)
VLDL: 46.4 mg/dL — ABNORMAL HIGH (ref 0.0–40.0)

## 2022-04-26 LAB — COMPREHENSIVE METABOLIC PANEL
ALT: 18 U/L (ref 0–35)
AST: 20 U/L (ref 0–37)
Albumin: 4.7 g/dL (ref 3.5–5.2)
Alkaline Phosphatase: 58 U/L (ref 39–117)
BUN: 12 mg/dL (ref 6–23)
CO2: 29 mEq/L (ref 19–32)
Calcium: 10.3 mg/dL (ref 8.4–10.5)
Chloride: 103 mEq/L (ref 96–112)
Creatinine, Ser: 0.79 mg/dL (ref 0.40–1.20)
GFR: 74.41 mL/min (ref >60.00)
Glucose, Bld: 133 mg/dL — ABNORMAL HIGH (ref 70–99)
Potassium: 4.5 mEq/L (ref 3.5–5.1)
Sodium: 140 mEq/L (ref 135–145)
Total Bilirubin: 0.5 mg/dL (ref 0.2–1.2)
Total Protein: 7.7 g/dL (ref 6.0–8.3)

## 2022-04-26 LAB — HM DIABETES EYE EXAM

## 2022-04-26 LAB — HEMOGLOBIN A1C: Hgb A1c MFr Bld: 9.5 % — ABNORMAL HIGH (ref 4.6–6.5)

## 2022-04-26 LAB — LDL CHOLESTEROL, DIRECT: Direct LDL: 53 mg/dL

## 2022-04-26 LAB — VITAMIN B12: Vitamin B-12: 1270 pg/mL — ABNORMAL HIGH (ref 211–911)

## 2022-04-26 LAB — VITAMIN D 25 HYDROXY (VIT D DEFICIENCY, FRACTURES): VITD: 55.79 ng/mL (ref 30.00–100.00)

## 2022-04-26 MED ORDER — ALENDRONATE SODIUM 70 MG PO TABS: 70.0000 mg | ORAL_TABLET | ORAL | 3 refills | Status: DC

## 2022-04-26 MED ORDER — ROSUVASTATIN CALCIUM 10 MG PO TABS: 10.0000 mg | ORAL_TABLET | Freq: Every day | ORAL | 3 refills | Status: DC

## 2022-04-26 MED ORDER — LOSARTAN POTASSIUM 25 MG PO TABS: 25.0000 mg | ORAL_TABLET | Freq: Every day | ORAL | 3 refills | Status: DC

## 2022-04-26 MED ORDER — METFORMIN HCL ER (MOD) 1000 MG PO TB24
1000.0000 mg | ORAL_TABLET | Freq: Every day | ORAL | 3 refills | Status: DC
Start: 2022-04-26 — End: 2022-05-03

## 2022-04-26 MED ORDER — OMEPRAZOLE 40 MG PO CPDR: 40.0000 mg | DELAYED_RELEASE_CAPSULE | Freq: Every day | ORAL | 3 refills | Status: DC

## 2022-04-26 MED ORDER — FENOFIBRATE 160 MG PO TABS: 160.0000 mg | ORAL_TABLET | Freq: Every day | ORAL | 3 refills | Status: DC

## 2022-04-26 NOTE — Patient Instructions (Signed)
It was very nice to see you today!  Tell pharmacy need 3 months meds for [place]   PLEASE NOTE:  If you had any lab tests please let us know if you have not heard back within a few days. You may see your results on MyChart before we have a chance to review them but we will give you a call once they are reviewed by [place]. If we ordered any referrals today, please let us know if you have not heard from their office within the next week.   Please try these tips to maintain a healthy lifestyle:  Eat most of your calories during the day when you are active. Eliminate processed foods including packaged sweets (pies, cakes, cookies), reduce intake of potatoes, white bread, white pasta, and white rice. Look for whole grain options, oat flour or almond flour.  Each meal should contain half fruits/vegetables, one quarter protein, and one quarter carbs (no bigger than a computer mouse).  Cut down on sweet beverages. This includes juice, soda, and sweet tea. Also watch fruit intake, though this is a healthier sweet option, it still contains natural sugar! Limit to 3 servings daily.  Drink at least 1 glass of water with each meal and aim for at least 8 glasses per day  Exercise at least 150 minutes every week.

## 2022-04-26 NOTE — Progress Notes (Incomplete)
Subjective:     Patient ID: Cindy Guerrero, female    DOB: [birth year], 72 y.o.   MRN: [id number]  Chief Complaint  Patient presents with  . Follow-up    6 month follow-up for osteoporosis Fasting     HPI-here w/interpreter  Osteoporosis-back on Foxamax.  DM type 2-seeing endo 3/1.  131 this am.  Occ 150-160.   Going to [place] for 3 mo on 3/11 and needs 3 months of meds.   Health Maintenance Due  Topic Date Due  . OPHTHALMOLOGY EXAM  Never done  . COLONOSCOPY (Pts 45-55yr Insurance coverage will need to be confirmed)  Never done  . MAMMOGRAM  10/31/2016  . Medicare Annual Wellness (AWV)  05/10/2020  . HEMOGLOBIN A1C  04/18/2022    Past Medical History:  Diagnosis Date  . Diabetes ([place]   . Dyslipidemia   . Headache   . Osteoporosis     Past Surgical History:  Procedure Laterality Date  . EYE SURGERY     cataract surgery    Outpatient Medications Prior to Visit  Medication Sig Dispense Refill  . alendronate (FOSAMAX) 70 MG tablet Take 1 tablet (70 mg total) by mouth once a week. Take with a full glass of water on an empty stomach. 12 tablet 1  . Blood Glucose Monitoring Suppl (ONETOUCH VERIO) w/Device KIT Use as advsed 1 kit 1  . Calcium Carb-Cholecalciferol 600-800 MG-UNIT TABS Take by mouth.    . empagliflozin (JARDIANCE) 10 MG TABS tablet Take 1 tablet (10 mg total) by mouth daily before breakfast. 90 tablet 3  . fenofibrate 160 MG tablet Take 1 tablet (160 mg total) by mouth daily. 30 tablet 3  . gemfibrozil (LOPID) 600 MG tablet Take 600 mg by mouth 2 (two) times daily before a meal.    . glucose blood (ONETOUCH VERIO) test strip 1 each by Other route daily. And lancets 1/day.  E11.9 100 each 3  . Lancets (ONETOUCH DELICA PLUS L123XX123 MISC USE TO CHECK GLUCOSE twice DAILY 200 each 3  . losartan (COZAAR) 25 MG tablet Take 1 tablet (25 mg total) by mouth daily. 90 tablet 3  . meclizine (ANTIVERT) 25 MG tablet Take 1 tablet (25 mg total) by mouth 3 (three) times  daily as needed for up to 21 doses for dizziness. 21 tablet 0  . metFORMIN (GLUCOPHAGE-XR) 500 MG 24 hr tablet Take 4 tablets (2,000 mg total) by mouth daily. 360 tablet 3  . pioglitazone (ACTOS) 45 MG tablet Take 1 tablet (45 mg total) by mouth daily. 90 tablet 3  . Pyridoxine HCl (B-6 PO) Take 1 tablet by mouth daily.    . rosuvastatin (CRESTOR) 10 MG tablet Take 1 tablet (10 mg total) by mouth daily. 90 tablet 3  . VITAMIN D PO Take 1 capsule by mouth daily.    . vitamin E 1000 UNIT capsule Take 1,000 Units by mouth daily.     No facility-administered medications prior to visit.    Allergies  Allergen Reactions  . Atorvastatin Calcium     Other reaction(s): cough?  Other Reaction(s): cough?   ROS neg/noncontributory except as noted HPI/below Cough-throat irrit.  Going on for several months.  No f/c  no sob.  Occ runny nose.  Some heartburn.      Objective:     BP 124/60   Pulse 76   Temp 97.8 F (36.6 C) (Temporal)   Ht '4\' 11"'$  (1.499 m)   Wt 100 lb 6 oz (45.5 kg)  SpO2 99%   BMI 20.27 kg/m  Wt Readings from Last 3 Encounters:  04/26/22 100 lb 6 oz (45.5 kg)  11/21/21 105 lb 12.8 oz (48 kg)  10/26/21 107 lb 9.4 oz (48.8 kg)    Physical Exam   Gen: WDWN NAD HEENT: NCAT, conjunctiva not injected, sclera nonicteric NECK:  supple, no thyromegaly, no nodes, no carotid bruits CARDIAC: RRR, S1S2+, no murmur. DP 2+B LUNGS: CTAB. No wheezes ABDOMEN:  BS+, soft, NTND, No HSM, no masses EXT:  no edema MSK: no gross abnormalities.  NEURO: A&O x3.  CN II-XII intact.  PSYCH: normal mood. Good eye contact     Assessment & Plan:   Problem List Items Addressed This Visit       Musculoskeletal and Integument   Osteoporosis    No orders of the defined types were placed in this encounter.   Wellington Hampshire, MD

## 2022-04-28 NOTE — Progress Notes (Signed)
Continue medications as we discussed at appointment.  Sugars are out of control.  She sees the endocrinologist on March 1.  Defer treatment to them.

## 2022-04-28 NOTE — Progress Notes (Signed)
Subjective:     Patient ID: Cindy Guerrero, female    DOB: 06/07/1950, 72 y.o.   MRN: [id number]  Chief Complaint  Patient presents with   Follow-up    6 month follow-up for osteoporosis Fasting     HPI-here w/interpreter  Osteoporosis-back on Foxamax. No complaints DM type 2-seeing endo 3/1.  131 this am.  Occ 150-160.   Going to [place] for 3 mo on 3/11 and needs 3 months of meds. On metformin '500mg'$  tabs-some GI upset.  Wants the '1000mg'$  tabs-didn't have problems w/those.  On jardiance '10mg'$  , actos '45mg'$ , statin, and arb Mixed HLD-pt on crestor '10mg'$ .  According to med list, gemfibrozil and fenofibrate-pt doesn't know if taking both or not.   Health Maintenance Due  Topic Date Due   COLONOSCOPY (Pts 45-12yr Insurance coverage will need to be confirmed)  Never done   MAMMOGRAM  10/31/2016   Medicare Annual Wellness (AWV)  05/10/2020    Past Medical History:  Diagnosis Date   Diabetes ([place]    Dyslipidemia    Headache    Osteoporosis     Past Surgical History:  Procedure Laterality Date   EYE SURGERY     cataract surgery    Outpatient Medications Prior to Visit  Medication Sig Dispense Refill   Blood Glucose Monitoring Suppl (ONETOUCH VERIO) w/Device KIT Use as advsed 1 kit 1   Calcium Carb-Cholecalciferol 600-800 MG-UNIT TABS Take by mouth.     empagliflozin (JARDIANCE) 10 MG TABS tablet Take 1 tablet (10 mg total) by mouth daily before breakfast. 90 tablet 3   glucose blood (ONETOUCH VERIO) test strip 1 each by Other route daily. And lancets 1/day.  E11.9 100 each 3   Lancets (ONETOUCH DELICA PLUS L123XX123 MISC USE TO CHECK GLUCOSE twice DAILY 200 each 3   meclizine (ANTIVERT) 25 MG tablet Take 1 tablet (25 mg total) by mouth 3 (three) times daily as needed for up to 21 doses for dizziness. 21 tablet 0   pioglitazone (ACTOS) 45 MG tablet Take 1 tablet (45 mg total) by mouth daily. 90 tablet 3   Pyridoxine HCl (B-6 PO) Take 1 tablet by mouth daily.     VITAMIN D PO Take  1 capsule by mouth daily.     vitamin E 1000 UNIT capsule Take 1,000 Units by mouth daily.     alendronate (FOSAMAX) 70 MG tablet Take 1 tablet (70 mg total) by mouth once a week. Take with a full glass of water on an empty stomach. 12 tablet 1   fenofibrate 160 MG tablet Take 1 tablet (160 mg total) by mouth daily. 30 tablet 3   gemfibrozil (LOPID) 600 MG tablet Take 600 mg by mouth 2 (two) times daily before a meal.     losartan (COZAAR) 25 MG tablet Take 1 tablet (25 mg total) by mouth daily. 90 tablet 3   metFORMIN (GLUCOPHAGE-XR) 500 MG 24 hr tablet Take 4 tablets (2,000 mg total) by mouth daily. 360 tablet 3   rosuvastatin (CRESTOR) 10 MG tablet Take 1 tablet (10 mg total) by mouth daily. 90 tablet 3   No facility-administered medications prior to visit.    Allergies  Allergen Reactions   Atorvastatin Calcium     Other reaction(s): cough?  Other Reaction(s): cough?   ROS neg/noncontributory except as noted HPI/below Cough-throat irrit.  Going on for several months.  No f/c  no sob.  Occ runny nose.  Some heartburn. No cp, edema, sob, ha, dizziness.  Objective:     BP 124/60   Pulse 76   Temp 97.8 F (36.6 C) (Temporal)   Ht '4\' 11"'$  (1.499 m)   Wt 100 lb 6 oz (45.5 kg)   SpO2 99%   BMI 20.27 kg/m  Wt Readings from Last 3 Encounters:  04/26/22 100 lb 6 oz (45.5 kg)  11/21/21 105 lb 12.8 oz (48 kg)  10/26/21 107 lb 9.4 oz (48.8 kg)    Physical Exam   Gen: WDWN NAD HEENT: NCAT, conjunctiva not injected, sclera nonicteric NECK:  supple, no thyromegaly, no nodes, no carotid bruits CARDIAC: RRR, S1S2+, no murmur. DP 2+B LUNGS: CTAB. No wheezes ABDOMEN:  BS+, soft, NTND, No HSM, no masses EXT:  no edema MSK: no gross abnormalities.  NEURO: A&O x3.  CN II-XII intact.  PSYCH: normal mood. Good eye contact     Assessment & Plan:   Problem List Items Addressed This Visit       Endocrine   Type 2 diabetes mellitus with hyperglycemia, without long-term  current use of insulin (HCC) - Primary   Relevant Medications   losartan (COZAAR) 25 MG tablet   rosuvastatin (CRESTOR) 10 MG tablet   metFORMIN (GLUMETZA) 1000 MG (MOD) 24 hr tablet   Other Relevant Orders   Comprehensive metabolic panel (Completed)   Hemoglobin A1c (Completed)   TSH (Completed)   CBC with Differential/Platelet (Completed)   Vitamin B12 (Completed)     Musculoskeletal and Integument   Osteoporosis   Relevant Medications   alendronate (FOSAMAX) 70 MG tablet   Other Relevant Orders   VITAMIN D 25 Hydroxy (Vit-D Deficiency, Fractures) (Completed)     Other   Dyslipidemia   Relevant Medications   fenofibrate 160 MG tablet   rosuvastatin (CRESTOR) 10 MG tablet   Other Visit Diagnoses     Hyperlipidemia associated with type 2 diabetes mellitus (HCC)       Relevant Medications   fenofibrate 160 MG tablet   losartan (COZAAR) 25 MG tablet   rosuvastatin (CRESTOR) 10 MG tablet   metFORMIN (GLUMETZA) 1000 MG (MOD) 24 hr tablet   Other Relevant Orders   Comprehensive metabolic panel (Completed)   TSH (Completed)   Lipid panel (Completed)   Chronic cough       Heartburn       Relevant Orders   CBC with Differential/Platelet (Completed)     1.  Osteoporosis-patient is taking Fosamax 70 mg weekly.  Doing well.  Continue.  Check TSH, BMP, vitamin D 2.  Cough-more chronic.  This may be reflux.-Will start omeprazole 40 mg daily.  She is leaving the country for 3 months.  If cough worsens, be seen in [place].  May need chest x-ray, etc. 3.  Mixed hyperlipidemia-chronic.  Not controlled.  Added Crestor 10 mg daily last visit.  Renewed.  Some confusion on if she is taking Lopid and fenofibrate 160.  Advised to stop the Lopid.  Continue fenofibrate 160 mg daily.  Med list was printed and given to her.  Check lipids, CMP 4.  Type 2 diabetes-chronic.  Seems better controlled.  She does have follow-up with endocrinologist on March 1.  Will defer medications to them.  She is  having some side effects to metformin XR 500 mg tablets.  States she did better on the 1000 mg.  Advised that it may be an insurance problem why she was written for 2 500s twice daily.  I wrote for 1000 mg twice daily.  Will see if she does  better with that.  Continue Jardiance 10 mg and Actos 45 mg.  Patient is also on losartan 25 mg daily.  This was renewed.  Check CBC, CMP, B12.  Keep appointment with endocrinology.  Meds ordered this encounter  Medications   alendronate (FOSAMAX) 70 MG tablet    Sig: Take 1 tablet (70 mg total) by mouth once a week. Take with a full glass of water on an empty stomach.    Dispense:  12 tablet    Refill:  3   fenofibrate 160 MG tablet    Sig: Take 1 tablet (160 mg total) by mouth daily.    Dispense:  90 tablet    Refill:  3   losartan (COZAAR) 25 MG tablet    Sig: Take 1 tablet (25 mg total) by mouth daily.    Dispense:  90 tablet    Refill:  3   rosuvastatin (CRESTOR) 10 MG tablet    Sig: Take 1 tablet (10 mg total) by mouth daily.    Dispense:  90 tablet    Refill:  3   omeprazole (PRILOSEC) 40 MG capsule    Sig: Take 1 capsule (40 mg total) by mouth daily.    Dispense:  90 capsule    Refill:  3   metFORMIN (GLUMETZA) 1000 MG (MOD) 24 hr tablet    Sig: Take 1 tablet (1,000 mg total) by mouth daily with breakfast.    Dispense:  180 tablet    Refill:  3     Wellington Hampshire, MD

## 2022-05-03 ENCOUNTER — Encounter: Payer: Self-pay | Admitting: Internal Medicine

## 2022-05-03 ENCOUNTER — Ambulatory Visit (INDEPENDENT_AMBULATORY_CARE_PROVIDER_SITE_OTHER): Payer: 59 | Admitting: Internal Medicine

## 2022-05-03 VITALS — BP 100/60 | HR 86 | Ht 59.0 in | Wt 100.2 lb

## 2022-05-03 DIAGNOSIS — E1142 Type 2 diabetes mellitus with diabetic polyneuropathy: Secondary | ICD-10-CM | POA: Diagnosis not present

## 2022-05-03 DIAGNOSIS — E785 Hyperlipidemia, unspecified: Secondary | ICD-10-CM | POA: Diagnosis not present

## 2022-05-03 LAB — POCT GLUCOSE (DEVICE FOR HOME USE): POC Glucose: 134 mg/dl — AB (ref 70–99)

## 2022-05-03 MED ORDER — METFORMIN HCL ER 500 MG PO TB24
ORAL_TABLET | ORAL | 3 refills | Status: DC
Start: 2022-05-03 — End: 2023-04-04

## 2022-05-03 MED ORDER — PIOGLITAZONE HCL 45 MG PO TABS: 45.0000 mg | ORAL_TABLET | Freq: Every day | ORAL | 3 refills | Status: DC

## 2022-05-03 NOTE — Patient Instructions (Addendum)
Please increase: - Metformin ER 1000 mg in am and 500 mg with dinner  Continue: - Actos 45 mg before breakfast - Jardiance 10 mg before breakfast  Check sugars 1-2x a day and write them down.  Please return for another visit in 3-4 months.

## 2022-05-03 NOTE — Progress Notes (Signed)
Patient ID: Jacklene Behrendt, female   DOB: [birth year], 72 y.o.   MRN: [id number]  HPI: Cheryl-Ann Oakley is a 72 y.o.-year-old female, returning for follow-up for DM2, dx in 1998, non-insulin-dependent, uncontrolled, with complications (PN). Pt. previously saw Dr. Loanne Drilling, last visit 6 mo ago. She is here with her granddaughter who translates for [place] and offers information about her past medical history, diabetic regimen, and medications.  Interim history: No increased urination, blurry vision, nausea, chest pain. She is preparing to leave for [place] on 05/13/2022.  Reviewed HbA1c: Lab Results  Component Value Date   HGBA1C 9.5 (H) 04/26/2022   HGBA1C 8.0 (H) 10/16/2021   HGBA1C 8.0 (A) 06/01/2021   HGBA1C 6.0 (A) 06/02/2020   HGBA1C 6.1 (A) 12/03/2019   HGBA1C 6.3 (A) 05/13/2019   HGBA1C 6.2 (A) 01/13/2019   HGBA1C 6.4 (A) 09/25/2018   HGBA1C 6.4 (A) 01/08/2018   HGBA1C 6.6 (A) 09/19/2017   Pt is on a regimen of: - Metformin ER 1000 mg 2x a day, with meals >> AP in the evening >>  500 mg in am and 500 mg with dinner - Actos 45 mg before breakfast - Jardiance 10 mg before breakfast - started 10/2021 by PCP  At last visit, she was not checking blood sugars as she did not have lancets.  Since then, she started to check once a day: - am: n/c >> 128-131 - 2h after b'fast: n/c - before lunch: n/c - 2h after lunch: 178 - before dinner: n/c - 2h after dinner: n/c >> 130-140 - bedtime: n/c - nighttime: n/c Lowest sugar was  100. Highest sugar was 130 >> 178.  Glucometer: One Probation officer  She is cutting back on carbs: rice, sweets.  - no h/o CKD, last BUN/creatinine:  Lab Results  Component Value Date   BUN 12 04/26/2022   BUN 20 10/26/2021   CREATININE 0.79 04/26/2022   CREATININE 1.10 (H) 10/26/2021   Lab Results  Component Value Date   MICRALBCREAT 24.7 10/16/2021  On losartan 25 mg daily.  -HL; last set of lipids: Lab Results  Component Value Date   CHOL 136 04/26/2022   HDL  48.60 04/26/2022   LDLDIRECT 53.0 04/26/2022   TRIG 232.0 (H) 04/26/2022   CHOLHDL 3 04/26/2022  On Crestor 10 mg daily and fenofibrate 160 mg daily.  - last eye exam was 04/26/2022.  + DR. She has a history of cataract surgery.  - + numbness and tingling in her feet. + occasional burning on soles - relieved by walking.  Last foot exam 11/21/2021.  She also has a history of osteoporosis, managed by PCP.  She is on Fosamax 70 mg weekly.  ROS: + see HPI  Past Medical History:  Diagnosis Date   Diabetes ([place])    Dyslipidemia    Headache    Osteoporosis    Past Surgical History:  Procedure Laterality Date   EYE SURGERY     cataract surgery   Social History   Socioeconomic History   Marital status: Significant Other    Spouse name: Not on file   Number of children: 6   Years of education: Not on file   Highest education level: Not on file  Occupational History   Not on file  Tobacco Use   Smoking status: Never   Smokeless tobacco: Never  Vaping Use   Vaping Use: Never used  Substance and Sexual Activity   Alcohol use: No   Drug use: Never   Sexual activity:  Not Currently  Other Topics Concern   Not on file  Social History Narrative   Retired Estate manager/land agent   12 grands   9 greats   Social Determinants of Health   Financial Resource Strain: Not on file  Food Insecurity: Not on file  Transportation Needs: Not on file  Physical Activity: Not on file  Stress: Not on file  Social Connections: Not on file  Intimate Partner Violence: Not on file   Current Outpatient Medications on File Prior to Visit  Medication Sig Dispense Refill   alendronate (FOSAMAX) 70 MG tablet Take 1 tablet (70 mg total) by mouth once a week. Take with a full glass of water on an empty stomach. 12 tablet 3   Blood Glucose Monitoring Suppl (ONETOUCH VERIO) w/Device KIT Use as advsed 1 kit 1   Calcium Carb-Cholecalciferol 600-800 MG-UNIT TABS Take by mouth.     empagliflozin (JARDIANCE) 10 MG  TABS tablet Take 1 tablet (10 mg total) by mouth daily before breakfast. 90 tablet 3   fenofibrate 160 MG tablet Take 1 tablet (160 mg total) by mouth daily. 90 tablet 3   glucose blood (ONETOUCH VERIO) test strip 1 each by Other route daily. And lancets 1/day.  E11.9 100 each 3   Lancets (ONETOUCH DELICA PLUS 123XX123) MISC USE TO CHECK GLUCOSE twice DAILY 200 each 3   losartan (COZAAR) 25 MG tablet Take 1 tablet (25 mg total) by mouth daily. 90 tablet 3   meclizine (ANTIVERT) 25 MG tablet Take 1 tablet (25 mg total) by mouth 3 (three) times daily as needed for up to 21 doses for dizziness. 21 tablet 0   metFORMIN (GLUMETZA) 1000 MG (MOD) 24 hr tablet Take 1 tablet (1,000 mg total) by mouth daily with breakfast. 180 tablet 3   omeprazole (PRILOSEC) 40 MG capsule Take 1 capsule (40 mg total) by mouth daily. 90 capsule 3   pioglitazone (ACTOS) 45 MG tablet Take 1 tablet (45 mg total) by mouth daily. 90 tablet 3   Pyridoxine HCl (B-6 PO) Take 1 tablet by mouth daily.     rosuvastatin (CRESTOR) 10 MG tablet Take 1 tablet (10 mg total) by mouth daily. 90 tablet 3   VITAMIN D PO Take 1 capsule by mouth daily.     vitamin E 1000 UNIT capsule Take 1,000 Units by mouth daily.     No current facility-administered medications on file prior to visit.   Allergies  Allergen Reactions   Atorvastatin Calcium     Other reaction(s): cough?  Other Reaction(s): cough?   Family History  Problem Relation Age of Onset   Diabetes Mother    Hyperlipidemia Daughter    Diabetes Daughter    Asthma Daughter    PE: BP 100/60 (BP Location: Left Arm, Patient Position: Sitting, Cuff Size: Normal)   Pulse 86   Ht '4\' 11"'$  (1.499 m)   Wt 100 lb 3.2 oz (45.5 kg)   SpO2 98%   BMI 20.24 kg/m  Wt Readings from Last 3 Encounters:  05/03/22 100 lb 3.2 oz (45.5 kg)  04/26/22 100 lb 6 oz (45.5 kg)  11/21/21 105 lb 12.8 oz (48 kg)   Constitutional: normal weight, in NAD Eyes: no exophthalmos ENT: no thyromegaly, no  cervical lymphadenopathy Cardiovascular: RRR, No MRG Respiratory: CTA B Musculoskeletal: no deformities Skin: no rashes Neurological: no tremor with outstretched hands Diabetic Foot Exam - Simple   Simple Foot Form Diabetic Foot exam was performed with the following findings: Yes 05/03/2022  9:54 AM  Visual Inspection No deformities, no ulcerations, no other skin breakdown bilaterally: Yes Sensation Testing Intact to touch and monofilament testing bilaterally: Yes Pulse Check Posterior Tibialis and Dorsalis pulse intact bilaterally: Yes Comments    ASSESSMENT: 1. DM2, non-insulin-dependent, uncontrolled, with complications - Peripheral neuropathy  2.  Hyperlipidemia  PLAN:  1. Patient with longstanding, uncontrolled, type 2 diabetes, on oral antidiabetic regimen with metformin, TZD, and now also SGLT2 inhibitor, added before last visit.  At last visit, she was not checking sugars as she did not have lancets.  We called this in to her pharmacy along with another One Touch Verio meter.  It was impossible to change her regimen at that time without more information about the blood sugars but I did suggest to decrease the dose of metformin in the evening as she complained of abdominal discomfort/pain after taking 2 tablets with dinner.  HbA1c was 8.0%, above target, but she had an HbA1c that was higher, at 9.5% approximately a week ago. -At today's visit, however, we reviewed her blood sugars from home and they are almost all at goal.  They do not correlate with the HbA1c obtained last month.  We checked her CBG in the clinic approximately 2 hours after breakfast and this was correlating with her blood sugars at home, at 134.  Therefore, my suggestion was to not add any new medication for now, but I did advise her to increase the metformin dose in the morning, since she is now only taking 500 mg twice a day.  She agrees with this change. -I refilled her Actos and metformin especially since she  has a [place]mont[place] - I suggested to:  Patient Instructions  Please increase: - Metformin ER 1000 mg in am and 500 mg with dinner  Continue: - Actos 45 mg before breakfast - Jardiance 10 mg before breakfast  Check sugars 1-2x a day and write them down.  Please return for another visit in 3-4 months.  - advised to check sugars at different times of the day - 1x a day, rotating check times - advised for yearly eye exams >> she is UTD - return to clinic in 3-4 months  2.  Hyperlipidemia -Reviewed latest lipid panel from 04/2021: Triglycerides elevated, otherwise fractions at goal: Lab Results  Component Value Date   CHOL 136 04/26/2022   HDL 48.60 04/26/2022   LDLDIRECT 53.0 04/26/2022   TRIG 232.0 (H) 04/26/2022   CHOLHDL 3 04/26/2022  -She is on Crestor 10 mg daily and fenofibrate 160 mg daily.  No side effects.  CPhilemon Kingdom MD PhD [facility]

## 2022-08-06 ENCOUNTER — Telehealth: Payer: Self-pay | Admitting: Pharmacist

## 2022-08-06 NOTE — Telephone Encounter (Signed)
Pharmacy Quality Measure Review  This patient is appearing on the insurance-provided list for being at risk of failing the diabetes control measure this calendar year.  Last A1c was 9.5%  She is currently taking Jardiance 10mg  daily, metformin XR 200mg  - 2 tablets daily in morning and 1 tablet at night and pioglitazone 30mg  daily for diabetes  Jardiance - last refill was 04/17/2022 for 90 days Metformin ER - last refill was  05/03/2022 for 90 days for #270 Pioglitazone - last refill was 031/2024 for 90 days  She is also taking losartan for hypertension and rosuvastatin for hyperlipidemia.  Losartan - last refill 04/26/2022 for 90 days Rosuvastatin - last refill 04/17/2022 or 90 days   Please consider outreaching patient to discuss adherence, any barriers to adherence, and consider a referral to me for medication management support.

## 2022-08-08 ENCOUNTER — Other Ambulatory Visit: Payer: 59 | Admitting: Pharmacist

## 2022-08-08 DIAGNOSIS — E1165 Type 2 diabetes mellitus with hyperglycemia: Secondary | ICD-10-CM

## 2022-08-08 DIAGNOSIS — E785 Hyperlipidemia, unspecified: Secondary | ICD-10-CM

## 2022-08-08 DIAGNOSIS — Z79899 Other long term (current) drug therapy: Secondary | ICD-10-CM

## 2022-08-08 NOTE — Telephone Encounter (Signed)
Tried to call patient thru [place] interpreter - unable to reach patient with cell number (wrong number) and home number - no answer / no VM set up.  Called patient's granddaughter - see phone visit notes.

## 2022-08-08 NOTE — Progress Notes (Signed)
08/08/2022 Name: Cindy Guerrero MRN: [id number] DOB: October 07, 1950  Chief Complaint  Patient presents with   Medication Management    Adherence - initial visit    Cindy Guerrero is a 72 y.o. year old female who is [place] Spoke with patient's granddaughter.    They were referred to the pharmacist by a quality report for assistance in managing diabetes, hypertension, hyperlipidemia, medication access, and complex medication management.    Subjective:   Medication Access/Adherence  Current Pharmacy:  [facility] [address], [place] - [address] [place] [address] Phone: [phone] Fax: [phone]   Patient reports affordability concerns with their medications: No  Patient reports access/transportation concerns to their pharmacy: No  Patient reports adherence concerns with their medications:  No   but she is noted to have low adherence per insurance report and is not meeting diabetes control measure    This patient is appearing on the insurance-provided list for being at risk of failing the diabetes control measure this calendar year.  Last A1c was 9.5%   She is currently taking Jardiance 10mg  daily, metformin XR 200mg  - 2 tablets daily in morning and 1 tablet at night and pioglitazone 30mg  daily for diabetes   Jardiance - last refill was 04/17/2022 for 90 days Metformin ER - last refill was  05/03/2022 for 90 days for #270 Pioglitazone - last refill was 031/2024 for 90 days   She is also taking losartan for hypertension and rosuvastatin for hyperlipidemia.  Losartan - last refill 04/26/2022 for 90 days Rosuvastatin - last refill 04/17/2022 or 90 days        Objective:  Lab Results  Component Value Date   HGBA1C 9.5 (H) 04/26/2022    Lab Results  Component Value Date   CREATININE 0.79 04/26/2022   BUN 12 04/26/2022   NA 140 04/26/2022   K 4.5 04/26/2022   CL 103 04/26/2022   CO2 29 04/26/2022    Lab  Results  Component Value Date   CHOL 136 04/26/2022   HDL 48.60 04/26/2022   LDLDIRECT 53.0 04/26/2022   TRIG 232.0 (H) 04/26/2022   CHOLHDL 3 04/26/2022    Medications Reviewed Today     Reviewed by Henrene Pastor, RPH-CPP (Pharmacist) on 08/08/22 at 1330  Med List Status: <None>   Medication Order Taking? Sig Documenting Provider Last Dose Status Informant  alendronate (FOSAMAX) 70 MG tablet [id number] Yes Take 1 tablet (70 mg total) by mouth once a week. Take with a full glass of water on an empty stomach. Jeani Sow, MD Taking Active   Blood Glucose Monitoring Suppl [facility] VERIO) w/Device Andria Rhein [id number] Yes Use as [place] Abts, MD Taking Active   Calcium Carb-Cholecalciferol 600-800 MG-UNIT TABS [id number] Yes Take by mouth. [provider] Taking Active   empagliflozin (JARDIANCE) 10 MG TABS tablet [id number] Yes Take 1 tablet (10 mg total) by mouth daily before breakfast. Carlus Pavlov, MD Taking Active   fenofibrate 160 MG tablet [id number] Yes Take 1 tablet (160 mg total) by mouth daily. Jeani Sow, MD Taking Active   glucose blood [facility] VERIO) test strip [id number] Yes 1 each by Other route daily. And lancets 1/day.  E11.9 Jeani Sow, MD Taking Active   Lancets [facility] DELICA PLUS [place]) [place] [id number] Yes USE TO CHECK GLUCOSE twice DAILY Carlus Pavlov, MD Taking Active   losartan (COZAAR) 25 MG tablet [id number] Yes Take 1 tablet (25 mg total) by mouth daily. Lutricia Horsfall  Hilda Lias, MD Taking Active   meclizine (ANTIVERT) 25 MG tablet [id number] Yes Take 1 tablet (25 mg total) by mouth 3 (three) times daily as needed for up to 21 doses for dizziness. Terald Sleeper, MD Taking Active   metFORMIN (GLUCOPHAGE-XR) 500 MG 24 hr tablet [id number] Yes Take by mouth 2 tablets in am and 1 tablet at night Carlus Pavlov, MD Taking Active   omeprazole (PRILOSEC) 40 MG capsule [id number] Yes Take 1 capsule (40 mg total) by mouth daily. Jeani Sow, MD Taking Active   pioglitazone (ACTOS) 45 MG tablet [id number] Yes Take 1 tablet (45 mg total) by mouth daily. Carlus Pavlov, MD Taking Active   Pyridoxine HCl (B-6 PO) [id number] Yes Take 1 tablet by mouth daily. [provider] Taking Active   rosuvastatin (CRESTOR) 10 MG tablet [id number] Yes Take 1 tablet (10 mg total) by mouth daily. Jeani Sow, MD Taking Active   VITAMIN D PO [id number] Yes Take 1 capsule by mouth daily. [provider] Taking Active   vitamin E 1000 UNIT capsule [id number] Yes Take 1,000 Units by mouth daily. [provider] Taking Active               Assessment/Plan:  Low medication adherence per refill history.  Spoke to patient's granddaughter who helps with medications. She reports that she was just going to ask for refills from [facility] for her grandmother.  Reviewed medication list and reason to take each medication.  Reviewed current prescriptions and patient has refill available for all her current maintenance medications   Will continue to follow adherence.   Follow Up Plan: 1 month  Henrene Pastor, PharmD Clinical Pharmacist [facility]

## 2022-09-09 ENCOUNTER — Encounter: Payer: Self-pay | Admitting: Internal Medicine

## 2022-09-09 ENCOUNTER — Ambulatory Visit (INDEPENDENT_AMBULATORY_CARE_PROVIDER_SITE_OTHER): Payer: 59 | Admitting: Internal Medicine

## 2022-09-09 VITALS — BP 110/60 | HR 76 | Ht 59.0 in | Wt 100.4 lb

## 2022-09-09 DIAGNOSIS — E1142 Type 2 diabetes mellitus with diabetic polyneuropathy: Secondary | ICD-10-CM | POA: Diagnosis not present

## 2022-09-09 DIAGNOSIS — E785 Hyperlipidemia, unspecified: Secondary | ICD-10-CM | POA: Diagnosis not present

## 2022-09-09 DIAGNOSIS — E119 Type 2 diabetes mellitus without complications: Secondary | ICD-10-CM

## 2022-09-09 DIAGNOSIS — Z7984 Long term (current) use of oral hypoglycemic drugs: Secondary | ICD-10-CM | POA: Diagnosis not present

## 2022-09-09 LAB — HEMOGLOBIN A1C: Hemoglobin A1C: 9.2

## 2022-09-09 NOTE — Patient Instructions (Addendum)
Please continue: - Metformin ER 1000 mg in am and 500 mg with dinner - Actos 45 mg before breakfast - Jardiance 10 mg before breakfast  Please return for another visit in 4-6 months.

## 2022-09-09 NOTE — Progress Notes (Addendum)
Patient ID: Cindy Guerrero, female   DOB: [birth year], 72 y.o.   MRN: [id number]  HPI: Cindy Guerrero is a 72 y.o.-year-old female, returning for follow-up for DM2, dx in 1998, non-insulin-dependent, uncontrolled, with complications (PN). Pt. previously saw Dr. Everardo All, but last visit with me 4.5 months ago. We used the [place] interpreter service for today's visit.  Interim history: No increased urination, blurry vision, nausea, chest pain.  She mentions that she had some leg pain before, but not recently. She went to [place] for 3 months this summer. She continues to exercise daily - 1h in am and 1h in the pm.  Reviewed HbA1c: Lab Results  Component Value Date   HGBA1C 9.5 (H) 04/26/2022   HGBA1C 8.0 (H) 10/16/2021   HGBA1C 8.0 (A) 06/01/2021   HGBA1C 6.0 (A) 06/02/2020   HGBA1C 6.1 (A) 12/03/2019   HGBA1C 6.3 (A) 05/13/2019   HGBA1C 6.2 (A) 01/13/2019   HGBA1C 6.4 (A) 09/25/2018   HGBA1C 6.4 (A) 01/08/2018   HGBA1C 6.6 (A) 09/19/2017   Pt is on a regimen of: - Metformin ER 1000 mg 2x a day, with meals >> AP in the evening >>  500 mg in am and 500 mg with dinner >> 1000 mg in a.m. and 500 mg with dinner - Actos 45 mg before breakfast - Jardiance 10 mg before breakfast - started 10/2021 by PCP  She checks sugars once a day: - am: n/c >> 128-131 >> 109-126, 136 - 2h after b'fast: n/c - before lunch: n/c - 2h after lunch: 178 >> n/c - before dinner: n/c - 2h after dinner: n/c >> 130-140 >> 121-131 - bedtime: n/c - nighttime: n/c Lowest sugar was  100 >> 109. Highest sugar was 130 >> 178 >> 131.  Glucometer: [employer]  She is cutting back on carbs: rice, sweets.  - no h/o CKD, last BUN/creatinine:  Lab Results  Component Value Date   BUN 12 04/26/2022   BUN 20 10/26/2021   CREATININE 0.79 04/26/2022   CREATININE 1.10 (H) 10/26/2021   Lab Results  Component Value Date   MICRALBCREAT 24.7 10/16/2021  On losartan 25 mg daily.  -HL; last set of lipids: Lab Results   Component Value Date   CHOL 136 04/26/2022   HDL 48.60 04/26/2022   LDLDIRECT 53.0 04/26/2022   TRIG 232.0 (H) 04/26/2022   CHOLHDL 3 04/26/2022  On Crestor 10 mg daily and fenofibrate 160 mg daily.  - last eye exam was 04/26/2022.  + DR. She has a history of cataract surgery.  - + numbness and tingling in her feet.  Resolved occasional burning on soles - relieved by walking.  Last foot exam 05/03/2022.  She also has a history of osteoporosis, managed by PCP.  She is on Fosamax 70 mg weekly.  ROS: + see HPI  Past Medical History:  Diagnosis Date   Diabetes (HCC)    Dyslipidemia    Headache    Osteoporosis    Past Surgical History:  Procedure Laterality Date   EYE SURGERY     cataract surgery   Social History   Socioeconomic History   Marital status: Significant Other    Spouse name: Not on file   Number of children: 6   Years of education: Not on file   Highest education level: Not on file  Occupational History   Not on file  Tobacco Use   Smoking status: Never   Smokeless tobacco: Never  Vaping Use   Vaping Use: Never  used  Substance and Sexual Activity   Alcohol use: No   Drug use: Never   Sexual activity: Not Currently  Other Topics Concern   Not on file  Social History Narrative   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 Take with a full glass of water on an empty stomach. 12 tablet 3   Blood Glucose Monitoring Suppl (ONETOUCH VERIO) w/Device KIT Use as advsed 1 kit 1   Calcium Carb-Cholecalciferol 600-800  MG-UNIT TABS Take by mouth.     empagliflozin (JARDIANCE) 10 MG TABS tablet Take 1 tablet (10 mg total) by mouth daily before breakfast. 90 tablet 3   fenofibrate 160 MG tablet Take 1 tablet (160 mg total) by mouth daily. 90 tablet 3   glucose blood (ONETOUCH VERIO) test strip 1 each by Other route daily. And lancets 1/day.  E11.9 100 each 3   Lancets (ONETOUCH DELICA PLUS LANCET33G) MISC USE TO CHECK GLUCOSE twice DAILY 200 each 3   losartan (COZAAR) 25 MG tablet Take 1 tablet (25 mg total) by mouth daily. 90 tablet 3   meclizine (ANTIVERT) 25 MG tablet Take 1 tablet (25 mg total) by mouth 3 (three) times daily as needed for up to 21 doses for dizziness. 21 tablet 0   metFORMIN (GLUCOPHAGE-XR) 500 MG 24 hr tablet Take by mouth 2 tablets in am and 1 tablet at night 270 tablet 3   omeprazole (PRILOSEC) 40 MG capsule Take 1 capsule (40 mg total) by mouth daily. 90 capsule 3   pioglitazone (ACTOS) 45 MG tablet Take 1 tablet (45 mg total) by mouth daily. 90 tablet 3   Pyridoxine HCl (B-6 PO) Take 1 tablet by mouth daily.     rosuvastatin (CRESTOR) 10 MG tablet Take 1 tablet (10 mg total) by mouth daily. 90 tablet 3   VITAMIN D PO Take 1 capsule by mouth daily.     vitamin E 1000 UNIT capsule Take 1,000 Units by mouth daily.     No current facility-administered medications on file prior to visit.   Allergies  Allergen Reactions   Atorvastatin Calcium     Other reaction(s): cough?  Other Reaction(s): cough?   Family History  Problem Relation Age of Onset   Diabetes Mother    Hyperlipidemia Daughter    Diabetes Daughter    Asthma Daughter    PE: BP 110/60   Pulse 76   Ht 4\' 11"  (1.499 m)   Wt 100 lb 6.4 oz (45.5 kg)   SpO2 99%   BMI 20.28 kg/m  Wt Readings from Last 3 Encounters:  09/09/22 100 lb 6.4 oz (45.5 kg)  05/03/22 100 lb 3.2 oz (45.5 kg)  04/26/22 100 lb 6 oz (45.5 kg)   Constitutional: normal weight, in NAD Eyes: no exophthalmos ENT: no thyromegaly, no cervical  lymphadenopathy Cardiovascular: RRR, No MRG Respiratory: CTA B Musculoskeletal: no deformities Skin: no rashes Neurological: + tremor with outstretched hands  ASSESSMENT: 1. DM2, non-insulin-dependent, uncontrolled, with complications - Peripheral neuropathy  2.  Hyperlipidemia  PLAN:  1. Patient with longstanding, uncontrolled, type 2 diabetes, on oral antidiabetic regimen with metformin, TZD, SGLT2 inhibitor, with almost all blood sugars at goal at last visit.  These did not correlate with a higher HbA1c obtained the previous month, of 9.5%.  We checked her CBG in the clinic approximately 2 hours after breakfast and this was correlating with the blood sugars at home, at 134.  Therefore, I advised her to continue her regimen but increase the dose of metformin in the morning. -At today's visit, she continues to exercise consistently, 2 hours a day.  Her sugars are all at goal per review of her CBG log.  She checks twice a day.  Based on these, I do not feel she needs changes in her regimen.  However, HbA1c is still high.  I suspect that this is now very accurate for her.  Will check a fructosamine level today.  For now, I advised her to continue the same regimen. - I suggested to:  Patient Instructions  Please continue: - Metformin ER 1000 mg in am and 500 mg with dinner - Actos 45 mg before breakfast - Jardiance 10 mg before breakfast  Please return for another visit in 4-6 months.  - we checked her HbA1c: 9.2% (lower, but still higher than expected from her sugars at home - will check a fructosamine level, also) - advised to check sugars at different times of the day - 1x a day, rotating check times - advised for yearly eye exams >> she is UTD - return to clinic in 3-4 months  2.  Hyperlipidemia -Reviewed latest lipid panel from 04/2022: LDL and HDL at goal, triglycerides elevated: Lab Results  Component Value Date   CHOL 136 04/26/2022   HDL 48.60 04/26/2022   LDLDIRECT 53.0  04/26/2022   TRIG 232.0 (H) 04/26/2022   CHOLHDL 3 04/26/2022  -On Crestor 10 mg daily and fenofibrate 160 mg daily without side effects  Office Visit on 09/09/2022  Component Date Value Ref Range Status   Fructosamine 09/09/2022 380 (H)  205 - 285 umol/L Final   Msg sent: Dear Ms. Fowers, The HbA1c calculated from fructosamine is definitely lower than the directly measured HbA1c, at 8.0%.  It is still higher than I would expect... For now, my suggestion would be to continue the current regimen and also make sure that your test strips and glucometer are good. Sincerely, Carlus Pavlov MD  Carlus Pavlov, MD PhD [facility] Endocrinology

## 2022-09-12 ENCOUNTER — Other Ambulatory Visit: Payer: 59 | Admitting: Pharmacist

## 2022-09-12 LAB — FRUCTOSAMINE: Fructosamine: 380 umol/L — ABNORMAL HIGH (ref 205–285)

## 2022-09-13 ENCOUNTER — Ambulatory Visit (INDEPENDENT_AMBULATORY_CARE_PROVIDER_SITE_OTHER): Payer: 59 | Admitting: Family Medicine

## 2022-09-13 ENCOUNTER — Encounter: Payer: Self-pay | Admitting: Family Medicine

## 2022-09-13 VITALS — BP 120/62 | HR 71 | Temp 97.7°F | Resp 16 | Ht 59.0 in | Wt 102.0 lb

## 2022-09-13 DIAGNOSIS — Z86010 Personal history of colonic polyps: Secondary | ICD-10-CM

## 2022-09-13 DIAGNOSIS — L2084 Intrinsic (allergic) eczema: Secondary | ICD-10-CM | POA: Diagnosis not present

## 2022-09-13 DIAGNOSIS — Z0000 Encounter for general adult medical examination without abnormal findings: Secondary | ICD-10-CM

## 2022-09-13 DIAGNOSIS — E785 Hyperlipidemia, unspecified: Secondary | ICD-10-CM

## 2022-09-13 LAB — CBC WITH DIFFERENTIAL/PLATELET
Basophils Absolute: 0.1 10*3/uL (ref 0.0–0.1)
Basophils Relative: 1 % (ref 0.0–3.0)
Eosinophils Absolute: 0.1 10*3/uL (ref 0.0–0.7)
Eosinophils Relative: 1.4 % (ref 0.0–5.0)
HCT: 33.3 % — ABNORMAL LOW (ref 36.0–46.0)
Hemoglobin: 10.8 g/dL — ABNORMAL LOW (ref 12.0–15.0)
Lymphocytes Relative: 38.7 % (ref 12.0–46.0)
Lymphs Abs: 2.8 10*3/uL (ref 0.7–4.0)
MCHC: 32.5 g/dL (ref 30.0–36.0)
MCV: 78.6 fl (ref 78.0–100.0)
Monocytes Absolute: 0.5 10*3/uL (ref 0.1–1.0)
Monocytes Relative: 6.5 % (ref 3.0–12.0)
Neutro Abs: 3.8 10*3/uL (ref 1.4–7.7)
Neutrophils Relative %: 52.4 % (ref 43.0–77.0)
Platelets: 348 10*3/uL (ref 150.0–400.0)
RBC: 4.24 Mil/uL (ref 3.87–5.11)
RDW: 14.1 % (ref 11.5–15.5)
WBC: 7.2 10*3/uL (ref 4.0–10.5)

## 2022-09-13 LAB — COMPREHENSIVE METABOLIC PANEL
ALT: 21 U/L (ref 0–35)
AST: 22 U/L (ref 0–37)
Albumin: 4.4 g/dL (ref 3.5–5.2)
Alkaline Phosphatase: 38 U/L — ABNORMAL LOW (ref 39–117)
BUN: 18 mg/dL (ref 6–23)
CO2: 29 mEq/L (ref 19–32)
Calcium: 10.2 mg/dL (ref 8.4–10.5)
Chloride: 104 mEq/L (ref 96–112)
Creatinine, Ser: 1.11 mg/dL (ref 0.40–1.20)
GFR: 49.34 mL/min — ABNORMAL LOW (ref >60.00)
Glucose, Bld: 142 mg/dL — ABNORMAL HIGH (ref 70–99)
Potassium: 5.1 mEq/L (ref 3.5–5.1)
Sodium: 138 mEq/L (ref 135–145)
Total Bilirubin: 0.4 mg/dL (ref 0.2–1.2)
Total Protein: 7.3 g/dL (ref 6.0–8.3)

## 2022-09-13 LAB — LIPID PANEL
Cholesterol: 143 mg/dL (ref 0–200)
HDL: 61.4 mg/dL (ref >39.00)
LDL Cholesterol: 66 mg/dL (ref 0–99)
NonHDL: 81.88
Total CHOL/HDL Ratio: 2
Triglycerides: 80 mg/dL (ref 0.0–149.0)
VLDL: 16 mg/dL (ref 0.0–40.0)

## 2022-09-13 MED ORDER — TRIAMCINOLONE ACETONIDE 0.1 % EX CREA: 1.0000 | TOPICAL_CREAM | Freq: Two times a day (BID) | CUTANEOUS | 1 refills | Status: DC

## 2022-09-13 NOTE — Patient Instructions (Signed)

## 2022-09-13 NOTE — Progress Notes (Addendum)
Phone [phone]   Subjective:   Patient is a 72 y.o. female presenting for annual physical.    Chief Complaint  Patient presents with   Annual Exam    CPE Fasting   Here with interpreter and granddaughter.   Annual  DM Type 2 - Followed up with endocrinology on 7/8. Her A1C was elevated. She has been exercising 2x/day. Compliant with her Fosamax 70 mg. Checked her blood sugar this morning, reports a reading of 133.   Abdominal pain - She endorses abd pain, bloating, and occasional diarrhea. She is interested in a referral to GI to schedule her next colonoscopy. Last colonoscopy was done with [facility].  Dizziness - She reports dizziness when sitting down. At times has to sit for a while before standing up.   She reported occasional itching on forearm. Was advised to moisturize.   See problem oriented charting- ROS- ROS: Gen: no fever, chills  Skin: no rash. +itching ENT: no ear pain, ear drainage, nasal congestion, rhinorrhea, sinus pressure, sore throat Eyes: no blurry vision, double vision Resp: no cough, wheeze,SOB CV: no CP, palpitations, LE edema,  GI: no heartburn, n/v/c, +abd pain, +diarrhea GU: no dysuria, urgency, frequency, hematuria MSK: no joint pain, myalgias, back pain Neuro: no headache, weakness, vertigo. +dizziness.  Psych: no depression, anxiety, insomnia, SI   The following were reviewed and entered/updated in epic: Past Medical History:  Diagnosis Date   Diabetes (HCC)    Dyslipidemia    Headache    Osteoporosis    Patient Active Problem List   Diagnosis Date Noted   Type 2 diabetes mellitus with hyperglycemia, without long-term current use of insulin (HCC)    Dyslipidemia    Headache    Osteoporosis    Past Surgical History:  Procedure Laterality Date   EYE SURGERY     cataract surgery    Family History  Problem Relation Age of Onset   Diabetes Mother    Hyperlipidemia Daughter    Diabetes Daughter    Asthma Daughter      Medications- reviewed and updated Current Outpatient Medications  Medication Sig Dispense Refill   alendronate (FOSAMAX) 70 MG tablet Take 1 tablet (70 mg total) by mouth once a week. Take with a full glass of water on an empty stomach. 12 tablet 3   Blood Glucose Monitoring Suppl (ONETOUCH VERIO) w/Device KIT Use as advsed 1 kit 1   Calcium Carb-Cholecalciferol 600-800 MG-UNIT TABS Take by mouth.     empagliflozin (JARDIANCE) 10 MG TABS tablet Take 1 tablet (10 mg total) by mouth daily before breakfast. 90 tablet 3   fenofibrate 160 MG tablet Take 1 tablet (160 mg total) by mouth daily. 90 tablet 3   glucose blood (ONETOUCH VERIO) test strip 1 each by Other route daily. And lancets 1/day.  E11.9 100 each 3   Lancets (ONETOUCH DELICA PLUS LANCET33G) MISC USE TO CHECK GLUCOSE twice DAILY 200 each 3   losartan (COZAAR) 25 MG tablet Take 1 tablet (25 mg total) by mouth daily. 90 tablet 3   meclizine (ANTIVERT) 25 MG tablet Take 1 tablet (25 mg total) by mouth 3 (three) times daily as needed for up to 21 doses for dizziness. 21 tablet 0   metFORMIN (GLUCOPHAGE-XR) 500 MG 24 hr tablet Take by mouth 2 tablets in am and 1 tablet at night 270 tablet 3   omeprazole (PRILOSEC) 40 MG capsule Take 1 capsule (40 mg total) by mouth daily. 90 capsule 3   pioglitazone (ACTOS)  45 MG tablet Take 1 tablet (45 mg total) by mouth daily. 90 tablet 3   Pyridoxine HCl (B-6 PO) Take 1 tablet by mouth daily.     rosuvastatin (CRESTOR) 10 MG tablet Take 1 tablet (10 mg total) by mouth daily. 90 tablet 3   triamcinolone cream (KENALOG) 0.1 % Apply 1 Application topically 2 (two) times daily. 30 g 1   VITAMIN D PO Take 1 capsule by mouth daily.     vitamin E 1000 UNIT capsule Take 1,000 Units by mouth daily.     No current facility-administered medications for this visit.    Allergies-reviewed and updated Allergies  Allergen Reactions   Atorvastatin Calcium     Other reaction(s): cough?  Other Reaction(s):  cough?    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 DP 2+B LUNGS: CTAB. No wheezes ABDOMEN:  BS+, soft, NTND, No HSM, no masses EXT:  no edema MSK: no gross abnormalities. MS 5/5 all 4 NEURO: A&O x3.  CN II-XII intact.  PSYCH: normal mood. Good eye contact   Reviewed endocrinology notes.    Assessment and Plan   Health Maintenance counseling: 1. Anticipatory guidance: Patient counseled regarding regular dental exams q6 months, eye exams,  avoiding smoking and second hand smoke, limiting alcohol to 1 beverage per day, no illicit drugs.   2. Risk factor reduction:  Advised patient of need for regular exercise and diet rich and fruits and vegetables to reduce risk of heart attack and stroke. Exercise- 2x/day.  Wt Readings from Last 3 Encounters:  09/13/22 102 lb (46.3 kg)  09/09/22 100 lb 6.4 oz (45.5 kg)  05/03/22 100 lb 3.2 oz (45.5 kg)   3. Immunizations/screenings/ancillary studies Immunization History  Administered Date(s) Administered   Fluad Quad(high Dose 65+) 01/13/2019, 12/03/2019   Hepatitis B 03/31/1996   Influenza Split 02/12/2006, 11/02/2013   Influenza,inj,Quad PF,6+ Mos 11/11/2016   Influenza,inj,quad, With Preservative 11/09/2015   Influenza-Unspecified 10/27/2014, 11/20/2017, 10/24/2021   PFIZER(Purple Top)SARS-COV-2 Vaccination 05/16/2019, 06/07/2019, 12/19/2019   Pneumococcal Conjugate-13 01/30/2015   Pneumococcal Polysaccharide-23 11/09/2015   Td 09/12/1995, 03/31/1996   Tdap 11/02/2013, 01/08/2018   Zoster, Live 11/02/2013    Zoster, Unspecified 10/24/2021   Health Maintenance Due  Topic Date Due   Medicare Annual Wellness (AWV)  05/10/2020    4. Cervical cancer screening- N/a 5. Breast cancer screening-  mammogram N/a 6. Colon cancer screening - Last colonoscopy was done at [facility]. She reports 2 polyps were found, otherwise normal. Next due 02/2025. 7. Skin cancer screening- I advised regular sunscreen use. Denies worrisome, changing, or new skin lesions.  8. Birth control/STD check- N/a 9. Osteoporosis screening- Osteoporosis. Last bone density 11/26/2021. 10. Smoking associated screening - non smoker  Wellness examination  Dyslipidemia -     CBC with Differential/Platelet -     Comprehensive metabolic panel -     Lipid panel  Intrinsic eczema  Other orders -  Triamcinolone Acetonide; Apply 1 Application topically 2 (two) times daily.  Dispense: 30 g; Refill: 1    Recommended follow up: Return in about 6 months (around 03/16/2023) for chronic follow-up-cancel next month appt.  Lab/Order associations: + fasting   I,Rachel Rivera,acting as a scribe for Angelena Sole, MD.,have documented all relevant documentation on the behalf of Angelena Sole, MD,as directed by  Angelena Sole, MD while in the presence of Angelena Sole, MD.   Andrew Au Rivera,acting as a scribe for Angelena Sole, MD.,have documented all relevant documentation on the behalf of Angelena Sole, MD,as directed by  Angelena Sole, MD while in the presence of Angelena Sole, MD.  I, Isabelle Course, have reviewed all documentation for this visit. The documentation on 09/13/22 for the exam, diagnosis, procedures, and orders are all accurate and complete.   *** Isabelle Course

## 2022-09-15 NOTE — Progress Notes (Signed)
Hemoglobin a litle low again which may be more her normal.  In 1 month, repeat cbcd,iron studies, hemoglobin electrophoresis

## 2022-09-16 ENCOUNTER — Other Ambulatory Visit: Payer: Self-pay | Admitting: *Deleted

## 2022-09-16 DIAGNOSIS — D649 Anemia, unspecified: Secondary | ICD-10-CM

## 2022-09-19 NOTE — Addendum Note (Signed)
Addended by: Angelena Sole on: 09/19/2022 09:34 PM   Modules accepted: Orders

## 2022-10-14 ENCOUNTER — Other Ambulatory Visit: Payer: Self-pay | Admitting: Family Medicine

## 2022-10-14 ENCOUNTER — Ambulatory Visit (INDEPENDENT_AMBULATORY_CARE_PROVIDER_SITE_OTHER): Payer: 59

## 2022-10-14 VITALS — BP 130/68 | HR 79 | Temp 98.5°F | Wt 101.6 lb

## 2022-10-14 DIAGNOSIS — Z0000 Encounter for general adult medical examination without abnormal findings: Secondary | ICD-10-CM

## 2022-10-14 DIAGNOSIS — Z1231 Encounter for screening mammogram for malignant neoplasm of breast: Secondary | ICD-10-CM

## 2022-10-14 MED ORDER — ONETOUCH DELICA PLUS LANCET33G MISC: 3 refills | Status: DC

## 2022-10-14 NOTE — Progress Notes (Signed)
Subjective:   Cindy Guerrero is a 72 y.o. female who presents for an Initial Medicare Annual Wellness Visit.  Visit Complete: In person  Review of Systems     Cardiac Risk Factors include: advanced age (>25men, >46 women);diabetes mellitus     Objective:    Today's Vitals   10/14/22 0857  BP: 130/68  Pulse: 79  Temp: 98.5 F (36.9 C)  SpO2: 97%  Weight: 101 lb 9.6 oz (46.1 kg)   Body mass index is 20.52 kg/m.     10/14/2022    9:13 AM 10/26/2021    2:43 PM  Advanced Directives  Does Patient Have a Medical Advance Directive? No No  Would patient like information on creating a medical advance directive? No - Patient declined No - Patient declined    Current Medications (verified) Outpatient Encounter Medications as of 10/14/2022  Medication Sig   alendronate (FOSAMAX) 70 MG tablet Take 1 tablet (70 mg total) by mouth once a week. Take with a full glass of water on an empty stomach.   B Complex Vitamins (B COMPLEX 1 PO) Take by mouth.   Blood Glucose Monitoring Suppl (ONETOUCH VERIO) w/Device KIT Use as advsed   Calcium Carb-Cholecalciferol 600-800 MG-UNIT TABS Take by mouth.   Cyanocobalamin (VITAMIN B 12 PO) Take by mouth.   empagliflozin (JARDIANCE) 10 MG TABS tablet Take 1 tablet (10 mg total) by mouth daily before breakfast.   fenofibrate 160 MG tablet Take 1 tablet (160 mg total) by mouth daily.   glucose blood (ONETOUCH VERIO) test strip 1 each by Other route daily. And lancets 1/day.  E11.9   Lancets (ONETOUCH DELICA PLUS LANCET33G) MISC USE TO CHECK GLUCOSE twice DAILY   losartan (COZAAR) 25 MG tablet Take 1 tablet (25 mg total) by mouth daily.   meclizine (ANTIVERT) 25 MG tablet Take 1 tablet (25 mg total) by mouth 3 (three) times daily as needed for up to 21 doses for dizziness.   metFORMIN (GLUCOPHAGE-XR) 500 MG 24 hr tablet Take by mouth 2 tablets in am and 1 tablet at night   omeprazole (PRILOSEC) 40 MG capsule Take 1 capsule (40 mg total) by mouth daily.    pioglitazone (ACTOS) 45 MG tablet Take 1 tablet (45 mg total) by mouth daily.   Pyridoxine HCl (B-6 PO) Take 1 tablet by mouth daily.   rosuvastatin (CRESTOR) 10 MG tablet Take 1 tablet (10 mg total) by mouth daily.   triamcinolone cream (KENALOG) 0.1 % Apply 1 Application topically 2 (two) times daily.   VITAMIN D PO Take 1 capsule by mouth daily.   vitamin E 1000 UNIT capsule Take 1,000 Units by mouth daily.   No facility-administered encounter medications on file as of 10/14/2022.    Allergies (verified) Atorvastatin calcium   History: Past Medical History:  Diagnosis Date   Diabetes (HCC)    Dyslipidemia    Headache    Osteoporosis    Past Surgical History:  Procedure Laterality Date   EYE SURGERY     cataract surgery   Family History  Problem Relation Age of Onset   Diabetes Mother    Hyperlipidemia Daughter    Diabetes Daughter    Asthma Daughter    Social History   Socioeconomic History   Marital status: Significant Other    Spouse name: Not on file   Number of children: 6   Years of education: Not on file   Highest education level: Not on file  Occupational History   Not  on file  Tobacco Use   Smoking status: Never   Smokeless tobacco: Never  Vaping Use   Vaping status: Never Used  Substance and Sexual Activity   Alcohol use: No   Drug use: Never   Sexual activity: Not Currently  Other Topics Concern   Not on file  Social History Narrative   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   Social Determinants of Health   Financial Resource Strain: Low Risk  (10/14/2022)   Overall Financial Resource Strain (CARDIA)    Difficulty of Paying Living Expenses: Not hard at all  Food Insecurity: No Food Insecurity (10/14/2022)   Hunger Vital Sign    Worried About Running Out of Food in the Last Year: Never true    Ran Out of Food in the Last Year: Never true  Transportation Needs: No Transportation Needs (10/14/2022)   PRAPARE - Scientist, research (physical sciences) (Medical): No    Lack of Transportation (Non-Medical): No  Physical Activity: Sufficiently Active (10/14/2022)   Exercise Vital Sign    Days of Exercise per Week: 7 days    Minutes of Exercise per Session: 60 min  Stress: No Stress Concern Present (10/14/2022)   [employer] of Occupational Health - Occupational Stress Questionnaire    Feeling of Stress : Not at all  Social Connections: Moderately Isolated (10/14/2022)   Social Connection and Isolation Panel [NHANES]    Frequency of Communication with Friends and Family: More than three times a week    Frequency of Social Gatherings with Friends and Family: More than three times a week    Attends Religious Services: Never    Database administrator or Organizations: No    Attends Engineer, structural: Never    Marital Status: Living with partner    Tobacco Counseling Counseling given: Not Answered   Clinical Intake:  Pre-visit preparation completed: Yes  Pain : No/denies pain     BMI - recorded: 20.52 Nutritional Status: BMI of 19-24  Normal Nutritional Risks: None Diabetes: No  How often do you need to have someone help you when you read instructions, pamphlets, or other written materials from your doctor or pharmacy?: 4 - Often  Interpreter Needed?: Yes Interpreter Agency: communication/ [facility] Interpreter Name: Ybion Mlo Patient Declined Interpreter : No  Information entered by :: Lanier Ensign, LPN   Activities of Daily Living    10/14/2022    9:06 AM  In your present state of health, do you have any difficulty performing the following activities:  Hearing? 0  Vision? 0  Difficulty concentrating or making decisions? 0  Walking or climbing stairs? 0  Dressing or bathing? 0  Doing errands, shopping? 0  Preparing Food and eating ? N  Using the Toilet? N  In the past six months, have you accidently leaked urine? N  Do you have problems with loss of bowel control? N  Managing your  Medications? N  Managing your Finances? N  Housekeeping or managing your Housekeeping? N    Patient Care Team: Jeani Sow, MD as PCP - General (Family Medicine)  Indicate any recent Medical Services you may have received from other than [facility] providers in the past year (date may be approximate).     Assessment:   This is a routine wellness examination for Hawi.  Hearing/Vision screen Hearing Screening - Comments:: Pt denies any hearing issues  Vision Screening - Comments:: Pt follows up with eye provider  annually   Dietary issues and exercise activities discussed:     Goals Addressed             This Visit's Progress    Patient Stated       Stay healthy and eat good diet        Depression Screen    10/14/2022    9:10 AM 09/13/2022    8:06 AM 10/16/2021    1:51 PM  PHQ 2/9 Scores  PHQ - 2 Score 0 0 0  PHQ- 9 Score 0 1 0    Fall Risk    10/14/2022    9:14 AM 09/13/2022    8:05 AM 04/26/2022   10:26 AM 10/16/2021    1:41 PM  Fall Risk   Falls in the past year? 0 0 0 0  Number falls in past yr: 0 0 0 0  Injury with Fall? 0 0 0 0  Risk for fall due to : No Fall Risks No Fall Risks No Fall Risks No Fall Risks  Follow up Falls prevention discussed Falls evaluation completed Falls evaluation completed Falls evaluation completed    MEDICARE RISK AT HOME:  Medicare Risk at Home - 10/14/22 0915     Any stairs in or around the home? No    If so, are there any without handrails? No    Home free of loose throw rugs in walkways, pet beds, electrical cords, etc? Yes    Adequate lighting in your home to reduce risk of falls? Yes    Life alert? No    Use of a cane, walker or w/c? No    Grab bars in the bathroom? No    Shower chair or bench in shower? No    Elevated toilet seat or a handicapped toilet? No             TIMED UP AND GO:  Was the test performed? Yes  Length of time to ambulate 10 feet: 10 sec Gait steady and fast without use of assistive  device    Cognitive Function:        10/14/2022    9:15 AM  6CIT Screen  What Year? 0 points  What month? 0 points  What time? 0 points  Count back from 20 0 points  Months in reverse 0 points  Repeat phrase 0 points  Total Score 0 points    Immunizations Immunization History  Administered Date(s) Administered   Fluad Quad(high Dose 65+) 01/13/2019, 12/03/2019   Hepatitis B 03/31/1996   Influenza Split 02/12/2006, 11/02/2013   Influenza,inj,Quad PF,6+ Mos 11/11/2016   Influenza,inj,quad, With Preservative 11/09/2015   Influenza-Unspecified 10/27/2014, 11/20/2017, 10/24/2021   PFIZER(Purple Top)SARS-COV-2 Vaccination 05/16/2019, 06/07/2019, 12/19/2019   Pneumococcal Conjugate-13 01/30/2015   Pneumococcal Polysaccharide-23 11/09/2015   Td 09/12/1995, 03/31/1996   Tdap 11/02/2013, 01/08/2018   Zoster, Live 11/02/2013   Zoster, Unspecified 10/24/2021    TDAP status: Up to date  Flu Vaccine status: Due, Education has been provided regarding the importance of this vaccine. Advised may receive this vaccine at local pharmacy or Health Dept. Aware to provide a copy of the vaccination record if obtained from local pharmacy or Health Dept. Verbalized acceptance and understanding.  Pneumococcal vaccine status: Up to date  Covid-19 vaccine status: Information provided on how to obtain vaccines.   Qualifies for Shingles Vaccine? Yes   Zostavax completed No   Shingrix Completed?: No.    Education has been provided regarding the importance of this vaccine. Patient has  been advised to call insurance company to determine out of pocket expense if they have not yet received this vaccine. Advised may also receive vaccine at local pharmacy or Health Dept. Verbalized acceptance and understanding.  Screening Tests Health Maintenance  Topic Date Due   INFLUENZA VACCINE  10/03/2022   Diabetic kidney evaluation - Urine ACR  10/17/2022   COVID-19 Vaccine (4 - 2023-24 season) 10/29/2022  (Originally 11/02/2021)   MAMMOGRAM  11/01/2022 (Originally 10/31/2016)   Zoster Vaccines- Shingrix (1 of 2) 02/24/2023 (Originally 04/15/1969)   HEMOGLOBIN A1C  10/25/2022   OPHTHALMOLOGY EXAM  04/27/2023   FOOT EXAM  05/03/2023   Diabetic kidney evaluation - eGFR measurement  09/13/2023   Medicare Annual Wellness ([facility]V)  10/14/2023   Colonoscopy  02/07/2025   DTaP/Tdap/Td (5 - Td or Tdap) 01/09/2028   Pneumonia Vaccine 77+ Years old  Completed   DEXA SCAN  Completed   Hepatitis C Screening  Completed   HPV VACCINES  Aged Out    Health Maintenance  Health Maintenance Due  Topic Date Due   INFLUENZA VACCINE  10/03/2022   Diabetic kidney evaluation - Urine ACR  10/17/2022    Colorectal cancer screening: Type of screening: Colonoscopy. Completed 02/08/15. Repeat every 10 years  Mammogram status: Ordered 10/14/22. Pt provided with contact info and advised to call to schedule appt.   Bone Density status: Completed 11/26/21. Results reflect: Bone density results: OSTEOPOROSIS. Repeat every 2 years.  Additional Screening:  Hepatitis C Screening:  Completed 11/20/17  Vision Screening: Recommended annual ophthalmology exams for early detection of glaucoma and other disorders of the eye. Is the patient up to date with their annual eye exam?  Yes  Who is the provider or what is the name of the office in which the patient attends annual eye exams? Provider annually  If pt is not established with a provider, would they like to be referred to a provider to establish care? No .   Dental Screening: Recommended annual dental exams for proper oral hygiene  Diabetic Foot Exam: Diabetic Foot Exam: Completed 05/03/22  Community Resource Referral / Chronic Care Management: CRR required this visit?  No   CCM required this visit?  No     Plan:     I have personally reviewed and noted the following in the patient's chart:   Medical and social history Use of alcohol, tobacco or illicit drugs   Current medications and supplements including opioid prescriptions. Patient is not currently taking opioid prescriptions. Functional ability and status Nutritional status Physical activity Advanced directives List of other physicians Hospitalizations, surgeries, and ER visits in previous 12 months Vitals Screenings to include cognitive, depression, and falls Referrals and appointments  In addition, I have reviewed and discussed with patient certain preventive protocols, quality metrics, and best practice recommendations. A written personalized care plan for preventive services as well as general preventive health recommendations were provided to patient.     Marzella Schlein, LPN   06/03/270   After Visit Summary: (Declined) Due to this being a telephonic visit, with patients personalized plan was offered to patient but patient Declined AVS at this time   Nurse Notes: Pt requesting refill in lancets

## 2022-10-14 NOTE — Addendum Note (Signed)
Addended by: Marzella Schlein on: 10/14/2022 09:26 AM   Modules accepted: Orders

## 2022-10-14 NOTE — Progress Notes (Signed)
Per request from AWV

## 2022-10-14 NOTE — Patient Instructions (Signed)
Cindy Guerrero , Thank you for taking time to come for your Medicare Wellness Visit. I appreciate your ongoing commitment to your health goals. Please review the following plan we discussed and let me know if I can assist you in the future.   Referrals/Orders/Follow-Ups/Clinician Recommendations: stay healthy and eat a good diet, pt request refill on blood glucose lancets   This is a list of the screening recommended for you and due dates:  Health Maintenance  Topic Date Due   Flu Shot  10/03/2022   Yearly kidney health urinalysis for diabetes  10/17/2022   COVID-19 Vaccine (4 - 2023-24 season) 10/29/2022*   Mammogram  11/01/2022*   Zoster (Shingles) Vaccine (1 of 2) 02/24/2023*   Hemoglobin A1C  10/25/2022   Eye exam for diabetics  04/27/2023   Complete foot exam   05/03/2023   Yearly kidney function blood test for diabetes  09/13/2023   Medicare Annual Wellness Visit  10/14/2023   Colon Cancer Screening  02/07/2025   DTaP/Tdap/Td vaccine (5 - Td or Tdap) 01/09/2028   Pneumonia Vaccine  Completed   DEXA scan (bone density measurement)  Completed   Hepatitis C Screening  Completed   HPV Vaccine  Aged Out  *Topic was postponed. The date shown is not the original due date.    Advanced directives: (Declined) Advance directive discussed with you today. Even though you declined this today, please call our office should you change your mind, and we can give you the proper paperwork for you to fill out.  Next Medicare Annual Wellness Visit scheduled for next year: Yes  Preventive Care 47 Years and Older, Female Preventive care refers to lifestyle choices and visits with your health care provider that can promote health and wellness. What does preventive care include? A yearly physical exam. This is also called an annual well check. Dental exams once or twice a year. Routine eye exams. Ask your health care provider how often you should have your eyes checked. Personal lifestyle choices,  including: Daily care of your teeth and gums. Regular physical activity. Eating a healthy diet. Avoiding tobacco and drug use. Limiting alcohol use. Practicing safe sex. Taking low-dose aspirin every day. Taking vitamin and mineral supplements as recommended by your health care provider. What happens during an annual well check? The services and screenings done by your health care provider during your annual well check will depend on your age, overall health, lifestyle risk factors, and family history of disease. Counseling  Your health care provider may ask you questions about your: Alcohol use. Tobacco use. Drug use. Emotional well-being. Home and relationship well-being. Sexual activity. Eating habits. History of falls. Memory and ability to understand (cognition). Work and work Astronomer. Reproductive health. Screening  You may have the following tests or measurements: Height, weight, and BMI. Blood pressure. Lipid and cholesterol levels. These may be checked every 5 years, or more frequently if you are over 55 years old. Skin check. Lung cancer screening. You may have this screening every year starting at age 40 if you have a 30-pack-year history of smoking and currently smoke or have quit within the past 15 years. Fecal occult blood test (FOBT) of the stool. You may have this test every year starting at age 70. Flexible sigmoidoscopy or colonoscopy. You may have a sigmoidoscopy every 5 years or a colonoscopy every 10 years starting at age 21. Hepatitis C blood test. Hepatitis B blood test. Sexually transmitted disease (STD) testing. Diabetes screening. This is done by checking  your blood sugar (glucose) after you have not eaten for a while (fasting). You may have this done every 1-3 years. Bone density scan. This is done to screen for osteoporosis. You may have this done starting at age 24. Mammogram. This may be done every 1-2 years. Talk to your health care provider  about how often you should have regular mammograms. Talk with your health care provider about your test results, treatment options, and if necessary, the need for more tests. Vaccines  Your health care provider may recommend certain vaccines, such as: Influenza vaccine. This is recommended every year. Tetanus, diphtheria, and acellular pertussis (Tdap, Td) vaccine. You may need a Td booster every 10 years. Zoster vaccine. You may need this after age 65. Pneumococcal 13-valent conjugate (PCV13) vaccine. One dose is recommended after age 34. Pneumococcal polysaccharide (PPSV23) vaccine. One dose is recommended after age 40. Talk to your health care provider about which screenings and vaccines you need and how often you need them. This information is not intended to replace advice given to you by your health care provider. Make sure you discuss any questions you have with your health care provider. Document Released: 03/17/2015 Document Revised: 11/08/2015 Document Reviewed: 12/20/2014 Elsevier Interactive Patient Education  2017 [employer].  Fall Prevention in the Home Falls can cause injuries. They can happen to people of all ages. There are many things you can do to make your home safe and to help prevent falls. What can I do on the outside of my home? Regularly fix the edges of walkways and driveways and fix any cracks. Remove anything that might make you trip as you walk through a door, such as a raised step or threshold. Trim any bushes or trees on the path to your home. Use bright outdoor lighting. Clear any walking paths of anything that might make someone trip, such as rocks or tools. Regularly check to see if handrails are loose or broken. Make sure that both sides of any steps have handrails. Any raised decks and porches should have guardrails on the edges. Have any leaves, snow, or ice cleared regularly. Use sand or salt on walking paths during winter. Clean up any spills in  your garage right away. This includes oil or grease spills. What can I do in the bathroom? Use night lights. Install grab bars by the toilet and in the tub and shower. Do not use towel bars as grab bars. Use non-skid mats or decals in the tub or shower. If you need to sit down in the shower, use a plastic, non-slip stool. Keep the floor dry. Clean up any water that spills on the floor as soon as it happens. Remove soap buildup in the tub or shower regularly. Attach bath mats securely with double-sided non-slip rug tape. Do not have throw rugs and other things on the floor that can make you trip. What can I do in the bedroom? Use night lights. Make sure that you have a light by your bed that is easy to reach. Do not use any sheets or blankets that are too big for your bed. They should not hang down onto the floor. Have a firm chair that has side arms. You can use this for support while you get dressed. Do not have throw rugs and other things on the floor that can make you trip. What can I do in the kitchen? Clean up any spills right away. Avoid walking on wet floors. Keep items that you use a lot  in easy-to-reach places. If you need to reach something above you, use a strong step stool that has a grab bar. Keep electrical cords out of the way. Do not use floor polish or wax that makes floors slippery. If you must use wax, use non-skid floor wax. Do not have throw rugs and other things on the floor that can make you trip. What can I do with my stairs? Do not leave any items on the stairs. Make sure that there are handrails on both sides of the stairs and use them. Fix handrails that are broken or loose. Make sure that handrails are as long as the stairways. Check any carpeting to make sure that it is firmly attached to the stairs. Fix any carpet that is loose or worn. Avoid having throw rugs at the top or bottom of the stairs. If you do have throw rugs, attach them to the floor with carpet  tape. Make sure that you have a light switch at the top of the stairs and the bottom of the stairs. If you do not have them, ask someone to add them for you. What else can I do to help prevent falls? Wear shoes that: Do not have high heels. Have rubber bottoms. Are comfortable and fit you well. Are closed at the toe. Do not wear sandals. If you use a stepladder: Make sure that it is fully opened. Do not climb a closed stepladder. Make sure that both sides of the stepladder are locked into place. Ask someone to hold it for you, if possible. Clearly mark and make sure that you can see: Any grab bars or handrails. First and last steps. Where the edge of each step is. Use tools that help you move around (mobility aids) if they are needed. These include: Canes. Walkers. Scooters. Crutches. Turn on the lights when you go into a dark area. Replace any light bulbs as soon as they burn out. Set up your furniture so you have a clear path. Avoid moving your furniture around. If any of your floors are uneven, fix them. If there are any pets around you, be aware of where they are. Review your medicines with your doctor. Some medicines can make you feel dizzy. This can increase your chance of falling. Ask your doctor what other things that you can do to help prevent falls. This information is not intended to replace advice given to you by your health care provider. Make sure you discuss any questions you have with your health care provider. Document Released: 12/15/2008 Document Revised: 07/27/2015 Document Reviewed: 03/25/2014 Elsevier Interactive Patient Education  2017 [employer].

## 2022-10-17 ENCOUNTER — Other Ambulatory Visit (INDEPENDENT_AMBULATORY_CARE_PROVIDER_SITE_OTHER): Payer: 59

## 2022-10-17 DIAGNOSIS — N912 Amenorrhea, unspecified: Secondary | ICD-10-CM

## 2022-10-17 DIAGNOSIS — D649 Anemia, unspecified: Secondary | ICD-10-CM

## 2022-10-17 LAB — CBC WITH DIFFERENTIAL/PLATELET
Basophils Absolute: 0 10*3/uL (ref 0.0–0.1)
Basophils Relative: 0.7 % (ref 0.0–3.0)
Eosinophils Absolute: 0.1 10*3/uL (ref 0.0–0.7)
Eosinophils Relative: 1.6 % (ref 0.0–5.0)
HCT: 34.6 % — ABNORMAL LOW (ref 36.0–46.0)
Hemoglobin: 10.9 g/dL — ABNORMAL LOW (ref 12.0–15.0)
Lymphocytes Relative: 39.8 % (ref 12.0–46.0)
Lymphs Abs: 2.5 10*3/uL (ref 0.7–4.0)
MCHC: 31.6 g/dL (ref 30.0–36.0)
MCV: 79.6 fl (ref 78.0–100.0)
Monocytes Absolute: 0.4 10*3/uL (ref 0.1–1.0)
Monocytes Relative: 6.5 % (ref 3.0–12.0)
Neutro Abs: 3.2 10*3/uL (ref 1.4–7.7)
Neutrophils Relative %: 51.4 % (ref 43.0–77.0)
Platelets: 329 10*3/uL (ref 150.0–400.0)
RBC: 4.34 Mil/uL (ref 3.87–5.11)
RDW: 15.1 % (ref 11.5–15.5)
WBC: 6.2 10*3/uL (ref 4.0–10.5)

## 2022-10-21 LAB — HEMOGLOBINOPATHY EVALUATION
Fetal Hemoglobin Testing: <1 % (ref 0.0–1.9)
HCT: 36.5 % (ref 35.0–45.0)
Hemoglobin A2 - HGBRFX: 2.3 % (ref 2.0–3.2)
Hemoglobin: 11 g/dL — ABNORMAL LOW (ref 11.7–15.5)
Hgb A: 97.7 % (ref >96.0)
MCH: 25.9 pg — ABNORMAL LOW (ref 27.0–33.0)
MCV: 86.1 fL (ref 80.0–100.0)
RBC: 4.24 10*6/uL (ref 3.80–5.10)
RDW: 14.3 % (ref 11.0–15.0)

## 2022-10-21 LAB — IRON,TIBC AND FERRITIN PANEL
%SAT: 13 % (calc) — ABNORMAL LOW (ref 16–45)
Ferritin: 67 ng/mL (ref 16–288)
Iron: 50 ug/dL (ref 45–160)
TIBC: 400 ug/dL (ref 250–450)

## 2022-10-23 NOTE — Progress Notes (Signed)
Stable.  Looks like GI has tried to call her to schedule for appt.  [phone].

## 2022-10-24 ENCOUNTER — Telehealth: Payer: Self-pay | Admitting: *Deleted

## 2022-10-24 ENCOUNTER — Telehealth: Payer: Self-pay | Admitting: Gastroenterology

## 2022-10-24 NOTE — Telephone Encounter (Signed)
Patient's granddaughter called back and stated that she called GI to schedule and they stated they could not schedule her until they talked to the doctor because she was seen by Deboraha Sprang a few years ago.

## 2022-10-24 NOTE — Telephone Encounter (Signed)
Left detailed message for granddaughter to give the office a call back.

## 2022-10-24 NOTE — Telephone Encounter (Signed)
Good afternoon Dr. Chales Abrahams  The following patient is being referred to [place] to have a colonoscopy. She had one with [facility] in 2016 and wants to continue care with [place] per the referral request. Records are available in Epic. Please review and advise of scheduling. Thank you.

## 2022-10-25 ENCOUNTER — Ambulatory Visit: Payer: 59 | Admitting: Family Medicine

## 2022-10-27 NOTE — Telephone Encounter (Signed)
Reviewed colon 02/2015: neg (small polyps were hyperplastic).  Recall 10 years  She can wait until Dec 2026 for routine colonoscopy.  Certainly earlier if any problems or change in family history. Please call her and see if she has any problems or family history of colon cancer  RG

## 2022-12-13 ENCOUNTER — Encounter: Payer: Self-pay | Admitting: *Deleted

## 2022-12-13 NOTE — Progress Notes (Signed)
[facility] Quality Team Note  Name: Taria Castrillo Date of Birth: June 08, 1950 MRN: [id number] Date: 12/13/2022  [facility] Quality Team has reviewed this patient's chart, please see recommendations below:   Please consider scheduling an appointment for this patient to obtain labs to include an A1C and a urine albumin/creatinine ratio to close the KED and GSD gaps.

## 2022-12-18 ENCOUNTER — Other Ambulatory Visit: Payer: Self-pay

## 2022-12-18 DIAGNOSIS — E119 Type 2 diabetes mellitus without complications: Secondary | ICD-10-CM

## 2022-12-20 ENCOUNTER — Other Ambulatory Visit: Payer: Self-pay | Admitting: Internal Medicine

## 2022-12-20 DIAGNOSIS — E1142 Type 2 diabetes mellitus with diabetic polyneuropathy: Secondary | ICD-10-CM

## 2022-12-20 NOTE — Telephone Encounter (Signed)
Jardiance refill request complete

## 2022-12-23 ENCOUNTER — Other Ambulatory Visit (INDEPENDENT_AMBULATORY_CARE_PROVIDER_SITE_OTHER): Payer: 59

## 2022-12-23 DIAGNOSIS — E119 Type 2 diabetes mellitus without complications: Secondary | ICD-10-CM | POA: Diagnosis not present

## 2022-12-23 DIAGNOSIS — Z7984 Long term (current) use of oral hypoglycemic drugs: Secondary | ICD-10-CM | POA: Diagnosis not present

## 2022-12-23 DIAGNOSIS — Z1231 Encounter for screening mammogram for malignant neoplasm of breast: Secondary | ICD-10-CM

## 2022-12-23 LAB — MICROALBUMIN / CREATININE URINE RATIO
Creatinine,U: 14.1 mg/dL
Microalb Creat Ratio: 6.3 mg/g (ref 0.0–30.0)
Microalb, Ur: 0.9 mg/dL (ref 0.0–1.9)

## 2022-12-23 LAB — HEMOGLOBIN A1C: Hgb A1c MFr Bld: 7.2 % — ABNORMAL HIGH (ref 4.6–6.5)

## 2022-12-23 NOTE — Progress Notes (Signed)
A1C improving.  Continue.  Care per Dr Elvera Lennox

## 2023-02-17 ENCOUNTER — Emergency Department (HOSPITAL_BASED_OUTPATIENT_CLINIC_OR_DEPARTMENT_OTHER): Payer: 59

## 2023-02-17 ENCOUNTER — Encounter (HOSPITAL_BASED_OUTPATIENT_CLINIC_OR_DEPARTMENT_OTHER): Payer: Self-pay | Admitting: Emergency Medicine

## 2023-02-17 ENCOUNTER — Emergency Department (HOSPITAL_BASED_OUTPATIENT_CLINIC_OR_DEPARTMENT_OTHER)
Admission: EM | Admit: 2023-02-17 | Discharge: 2023-02-17 | Disposition: A | Discharge status: Home / Self Care | Payer: 59 | Attending: Emergency Medicine | Admitting: Emergency Medicine

## 2023-02-17 ENCOUNTER — Other Ambulatory Visit: Payer: Self-pay

## 2023-02-17 DIAGNOSIS — S20211A Contusion of right front wall of thorax, initial encounter: Secondary | ICD-10-CM | POA: Diagnosis not present

## 2023-02-17 DIAGNOSIS — S8001XA Contusion of right knee, initial encounter: Secondary | ICD-10-CM | POA: Insufficient documentation

## 2023-02-17 DIAGNOSIS — S199XXA Unspecified injury of neck, initial encounter: Secondary | ICD-10-CM | POA: Diagnosis not present

## 2023-02-17 DIAGNOSIS — K760 Fatty (change of) liver, not elsewhere classified: Secondary | ICD-10-CM | POA: Diagnosis not present

## 2023-02-17 DIAGNOSIS — S161XXA Strain of muscle, fascia and tendon at neck level, initial encounter: Secondary | ICD-10-CM | POA: Diagnosis not present

## 2023-02-17 DIAGNOSIS — M25561 Pain in right knee: Secondary | ICD-10-CM | POA: Diagnosis not present

## 2023-02-17 DIAGNOSIS — I700 Atherosclerosis of aorta: Secondary | ICD-10-CM | POA: Insufficient documentation

## 2023-02-17 DIAGNOSIS — M25512 Pain in left shoulder: Secondary | ICD-10-CM | POA: Diagnosis not present

## 2023-02-17 DIAGNOSIS — S0990XA Unspecified injury of head, initial encounter: Secondary | ICD-10-CM | POA: Insufficient documentation

## 2023-02-17 DIAGNOSIS — E041 Nontoxic single thyroid nodule: Secondary | ICD-10-CM | POA: Insufficient documentation

## 2023-02-17 DIAGNOSIS — S3993XA Unspecified injury of pelvis, initial encounter: Secondary | ICD-10-CM | POA: Diagnosis not present

## 2023-02-17 DIAGNOSIS — S3991XA Unspecified injury of abdomen, initial encounter: Secondary | ICD-10-CM | POA: Diagnosis not present

## 2023-02-17 DIAGNOSIS — M1711 Unilateral primary osteoarthritis, right knee: Secondary | ICD-10-CM | POA: Diagnosis not present

## 2023-02-17 DIAGNOSIS — Y92410 Unspecified street and highway as the place of occurrence of the external cause: Secondary | ICD-10-CM | POA: Diagnosis not present

## 2023-02-17 DIAGNOSIS — S299XXA Unspecified injury of thorax, initial encounter: Secondary | ICD-10-CM | POA: Diagnosis not present

## 2023-02-17 DIAGNOSIS — K429 Umbilical hernia without obstruction or gangrene: Secondary | ICD-10-CM | POA: Insufficient documentation

## 2023-02-17 DIAGNOSIS — S20212A Contusion of left front wall of thorax, initial encounter: Secondary | ICD-10-CM | POA: Diagnosis not present

## 2023-02-17 DIAGNOSIS — I2510 Atherosclerotic heart disease of native coronary artery without angina pectoris: Secondary | ICD-10-CM | POA: Diagnosis not present

## 2023-02-17 HISTORY — DX: Essential (primary) hypertension: I10

## 2023-02-17 LAB — URINALYSIS, ROUTINE W REFLEX MICROSCOPIC
Bilirubin Urine: NEGATIVE
Glucose, UA: >=500 mg/dL — AB
Hgb urine dipstick: NEGATIVE
Ketones, ur: NEGATIVE mg/dL
Leukocytes,Ua: NEGATIVE
Nitrite: NEGATIVE
Protein, ur: NEGATIVE mg/dL
Specific Gravity, Urine: 1.015 (ref 1.005–1.030)
pH: 7.5 (ref 5.0–8.0)

## 2023-02-17 LAB — CBC WITH DIFFERENTIAL/PLATELET
Abs Immature Granulocytes: 0.02 10*3/uL (ref 0.00–0.07)
Basophils Absolute: 0.1 10*3/uL (ref 0.0–0.1)
Basophils Relative: 1 %
Eosinophils Absolute: 0.1 10*3/uL (ref 0.0–0.5)
Eosinophils Relative: 1 %
HCT: 35.2 % — ABNORMAL LOW (ref 36.0–46.0)
Hemoglobin: 11 g/dL — ABNORMAL LOW (ref 12.0–15.0)
Immature Granulocytes: 0 %
Lymphocytes Relative: 26 %
Lymphs Abs: 2.4 10*3/uL (ref 0.7–4.0)
MCH: 25.3 pg — ABNORMAL LOW (ref 26.0–34.0)
MCHC: 31.3 g/dL (ref 30.0–36.0)
MCV: 81.1 fL (ref 80.0–100.0)
Monocytes Absolute: 0.6 10*3/uL (ref 0.1–1.0)
Monocytes Relative: 6 %
Neutro Abs: 6.1 10*3/uL (ref 1.7–7.7)
Neutrophils Relative %: 66 %
Platelets: 365 10*3/uL (ref 150–400)
RBC: 4.34 MIL/uL (ref 3.87–5.11)
RDW: 14.4 % (ref 11.5–15.5)
WBC: 9.3 10*3/uL (ref 4.0–10.5)
nRBC: 0 % (ref 0.0–0.2)

## 2023-02-17 LAB — URINALYSIS, MICROSCOPIC (REFLEX): Bacteria, UA: NONE SEEN

## 2023-02-17 LAB — BASIC METABOLIC PANEL
Anion gap: 8 (ref 5–15)
BUN: 24 mg/dL — ABNORMAL HIGH (ref 8–23)
CO2: 26 mmol/L (ref 22–32)
Calcium: 9.6 mg/dL (ref 8.9–10.3)
Chloride: 102 mmol/L (ref 98–111)
Creatinine, Ser: 1.26 mg/dL — ABNORMAL HIGH (ref 0.44–1.00)
GFR, Estimated: 45 mL/min — ABNORMAL LOW (ref >60)
Glucose, Bld: 231 mg/dL — ABNORMAL HIGH (ref 70–99)
Potassium: 4.1 mmol/L (ref 3.5–5.1)
Sodium: 136 mmol/L (ref 135–145)

## 2023-02-17 MED ORDER — IOHEXOL 300 MG/ML  SOLN
100.0000 mL | Freq: Once | INTRAMUSCULAR | Status: AC | PRN
  Administered 2023-02-17: 80 mL via INTRAVENOUS
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
## 2023-02-19 ENCOUNTER — Ambulatory Visit: Payer: 59 | Admitting: Family Medicine

## 2023-02-19 ENCOUNTER — Encounter: Payer: Self-pay | Admitting: Family Medicine

## 2023-02-19 VITALS — BP 130/68 | HR 85 | Temp 98.1°F | Resp 16 | Ht 59.0 in | Wt 101.5 lb

## 2023-02-19 DIAGNOSIS — S060X0A Concussion without loss of consciousness, initial encounter: Secondary | ICD-10-CM | POA: Diagnosis not present

## 2023-02-19 DIAGNOSIS — S2020XA Contusion of thorax, unspecified, initial encounter: Secondary | ICD-10-CM

## 2023-02-19 DIAGNOSIS — M25561 Pain in right knee: Secondary | ICD-10-CM

## 2023-02-19 NOTE — Patient Instructions (Addendum)
Happy Holidays  Tylenol for pain  If needed, ibuprofen  See Dr. Denyse Amass tomorrow  Worse, confused, ER  [facility]  [address] on the 1st floor Phone number [phone]
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
## 2023-02-20 ENCOUNTER — Ambulatory Visit: Payer: 59 | Admitting: Family Medicine

## 2023-02-21 ENCOUNTER — Other Ambulatory Visit: Payer: Self-pay | Admitting: Family Medicine

## 2023-02-21 MED ORDER — METHOCARBAMOL 500 MG PO TABS: 500.0000 mg | ORAL_TABLET | Freq: Three times a day (TID) | ORAL | 0 refills | Status: DC | PRN

## 2023-02-21 MED ORDER — TRAMADOL HCL 50 MG PO TABS: 50.0000 mg | ORAL_TABLET | Freq: Three times a day (TID) | ORAL | 0 refills | Status: AC | PRN

## 2023-03-12 ENCOUNTER — Ambulatory Visit: Payer: 59 | Admitting: Internal Medicine
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
## 2023-03-14 ENCOUNTER — Ambulatory Visit (INDEPENDENT_AMBULATORY_CARE_PROVIDER_SITE_OTHER): Payer: 59 | Admitting: Family Medicine

## 2023-03-14 ENCOUNTER — Encounter: Payer: Self-pay | Admitting: Family Medicine

## 2023-03-14 VITALS — BP 122/72 | HR 80 | Temp 97.1°F | Resp 18 | Ht 59.0 in | Wt 101.2 lb

## 2023-03-14 DIAGNOSIS — M816 Localized osteoporosis [Lequesne]: Secondary | ICD-10-CM

## 2023-03-14 DIAGNOSIS — I10 Essential (primary) hypertension: Secondary | ICD-10-CM | POA: Diagnosis not present

## 2023-03-14 DIAGNOSIS — E1169 Type 2 diabetes mellitus with other specified complication: Secondary | ICD-10-CM

## 2023-03-14 DIAGNOSIS — M25561 Pain in right knee: Secondary | ICD-10-CM

## 2023-03-14 DIAGNOSIS — E1165 Type 2 diabetes mellitus with hyperglycemia: Secondary | ICD-10-CM | POA: Diagnosis not present

## 2023-03-14 DIAGNOSIS — Z7984 Long term (current) use of oral hypoglycemic drugs: Secondary | ICD-10-CM

## 2023-03-14 DIAGNOSIS — E785 Hyperlipidemia, unspecified: Secondary | ICD-10-CM | POA: Diagnosis not present

## 2023-03-14 DIAGNOSIS — L2084 Intrinsic (allergic) eczema: Secondary | ICD-10-CM

## 2023-03-14 DIAGNOSIS — M810 Age-related osteoporosis without current pathological fracture: Secondary | ICD-10-CM | POA: Diagnosis not present

## 2023-03-14 MED ORDER — TRIAMCINOLONE ACETONIDE 0.1 % EX CREA: 1.0000 | TOPICAL_CREAM | Freq: Two times a day (BID) | CUTANEOUS | 1 refills | Status: AC

## 2023-03-14 NOTE — Patient Instructions (Signed)
 It was very nice to see you today!  Reschedule with endocrine   PLEASE NOTE:  If you had any lab tests please let us  know if you have not heard back within a few days. You may see your results on MyChart before we have a chance to review them but we will give you a call once they are reviewed by us . If we ordered any referrals today, please let us  know if you have not heard from their office within the next week.   Please try these tips to maintain a healthy lifestyle:  Eat most of your calories during the day when you are active. Eliminate processed foods including packaged sweets (pies, cakes, cookies), reduce intake of potatoes, white bread, white pasta, and white rice. Look for whole grain options, oat flour or almond flour.  Each meal should contain half fruits/vegetables, one quarter protein, and one quarter carbs (no bigger than a computer mouse).  Cut down on sweet beverages. This includes juice, soda, and sweet tea. Also watch fruit intake, though this is a healthier sweet option, it still contains natural sugar! Limit to 3 servings daily.  Drink at least 1 glass of water with each meal and aim for at least 8 glasses per day  Exercise at least 150 minutes every week.
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
## 2023-03-15 DIAGNOSIS — I10 Essential (primary) hypertension: Secondary | ICD-10-CM | POA: Insufficient documentation

## 2023-03-15 DIAGNOSIS — L2084 Intrinsic (allergic) eczema: Secondary | ICD-10-CM | POA: Insufficient documentation

## 2023-03-15 MED ORDER — FENOFIBRATE 160 MG PO TABS: 160.0000 mg | ORAL_TABLET | Freq: Every day | ORAL | 3 refills | Status: DC

## 2023-03-15 MED ORDER — OMEPRAZOLE 40 MG PO CPDR: 40.0000 mg | DELAYED_RELEASE_CAPSULE | Freq: Every day | ORAL | 3 refills | Status: DC

## 2023-03-15 MED ORDER — LOSARTAN POTASSIUM 25 MG PO TABS: 25.0000 mg | ORAL_TABLET | Freq: Every day | ORAL | 3 refills | Status: DC

## 2023-03-15 MED ORDER — ALENDRONATE SODIUM 70 MG PO TABS: 70.0000 mg | ORAL_TABLET | ORAL | 3 refills | Status: DC

## 2023-03-15 MED ORDER — ROSUVASTATIN CALCIUM 10 MG PO TABS: 10.0000 mg | ORAL_TABLET | Freq: Every day | ORAL | 3 refills | Status: DC

## 2023-03-15 NOTE — Assessment & Plan Note (Signed)
 Chronic.  Flaring as winter.  Renewed triamcinolone 0.1% cream

## 2023-03-15 NOTE — Assessment & Plan Note (Signed)
 Chronic.  Controlled.  Continue crestor 10mg  and fenofibrate 160mg 

## 2023-03-15 NOTE — Assessment & Plan Note (Signed)
 Chronic.  Better controlled.  Continue metformin 1500mg  metformin, jardiance 10mg , actos 45mg .  Get resch w/endo

## 2023-03-15 NOTE — Assessment & Plan Note (Signed)
 Chronic.  Stable.  Continue fosamax 70mg  weekly and staying active

## 2023-03-15 NOTE — Assessment & Plan Note (Signed)
Chronic.  Controlled.  Continue losartan 25 mg daily

## 2023-03-23 ENCOUNTER — Other Ambulatory Visit: Payer: Self-pay | Admitting: Internal Medicine

## 2023-03-23 DIAGNOSIS — E1142 Type 2 diabetes mellitus with diabetic polyneuropathy: Secondary | ICD-10-CM

## 2023-03-24 NOTE — Telephone Encounter (Signed)
Jardiance refill request complete

## 2023-03-27 ENCOUNTER — Ambulatory Visit (HOSPITAL_BASED_OUTPATIENT_CLINIC_OR_DEPARTMENT_OTHER)
Admission: RE | Admit: 2023-03-27 | Discharge: 2023-03-27 | Disposition: A | Discharge status: Home / Self Care | Payer: 59 | Source: Ambulatory Visit | Attending: Family Medicine | Admitting: Family Medicine

## 2023-03-27 ENCOUNTER — Encounter (HOSPITAL_BASED_OUTPATIENT_CLINIC_OR_DEPARTMENT_OTHER): Payer: 59 | Admitting: Radiology

## 2023-03-27 ENCOUNTER — Encounter (HOSPITAL_BASED_OUTPATIENT_CLINIC_OR_DEPARTMENT_OTHER): Payer: Self-pay | Admitting: Radiology

## 2023-03-27 ENCOUNTER — Other Ambulatory Visit (INDEPENDENT_AMBULATORY_CARE_PROVIDER_SITE_OTHER): Payer: 59

## 2023-03-27 DIAGNOSIS — M810 Age-related osteoporosis without current pathological fracture: Secondary | ICD-10-CM

## 2023-03-27 DIAGNOSIS — E785 Hyperlipidemia, unspecified: Secondary | ICD-10-CM

## 2023-03-27 DIAGNOSIS — Z1231 Encounter for screening mammogram for malignant neoplasm of breast: Secondary | ICD-10-CM

## 2023-03-27 DIAGNOSIS — E1165 Type 2 diabetes mellitus with hyperglycemia: Secondary | ICD-10-CM | POA: Diagnosis not present

## 2023-03-27 LAB — COMPREHENSIVE METABOLIC PANEL
ALT: 17 U/L (ref 0–35)
AST: 24 U/L (ref 0–37)
Albumin: 4.6 g/dL (ref 3.5–5.2)
Alkaline Phosphatase: 38 U/L — ABNORMAL LOW (ref 39–117)
BUN: 29 mg/dL — ABNORMAL HIGH (ref 6–23)
CO2: 30 meq/L (ref 19–32)
Calcium: 9.9 mg/dL (ref 8.4–10.5)
Chloride: 99 meq/L (ref 96–112)
Creatinine, Ser: 0.97 mg/dL (ref 0.40–1.20)
GFR: 57.79 mL/min — ABNORMAL LOW (ref >60.00)
Glucose, Bld: 140 mg/dL — ABNORMAL HIGH (ref 70–99)
Potassium: 4.4 meq/L (ref 3.5–5.1)
Sodium: 137 meq/L (ref 135–145)
Total Bilirubin: 0.3 mg/dL (ref 0.2–1.2)
Total Protein: 7.7 g/dL (ref 6.0–8.3)

## 2023-03-27 LAB — LIPID PANEL
Cholesterol: 164 mg/dL (ref 0–200)
HDL: 67.5 mg/dL (ref >39.00)
LDL Cholesterol: 79 mg/dL (ref 0–99)
NonHDL: 96.84
Total CHOL/HDL Ratio: 2
Triglycerides: 89 mg/dL (ref 0.0–149.0)
VLDL: 17.8 mg/dL (ref 0.0–40.0)

## 2023-03-27 LAB — VITAMIN D 25 HYDROXY (VIT D DEFICIENCY, FRACTURES): VITD: 86.2 ng/mL (ref 30.00–100.00)

## 2023-03-27 LAB — VITAMIN B12: Vitamin B-12: >1537 pg/mL — ABNORMAL HIGH (ref 211–911)

## 2023-03-27 LAB — HEMOGLOBIN A1C: Hgb A1c MFr Bld: 7.5 % — ABNORMAL HIGH (ref 4.6–6.5)

## 2023-03-28 ENCOUNTER — Encounter: Payer: Self-pay | Admitting: Family

## 2023-04-03 NOTE — Progress Notes (Signed)
Patient ID: Cindy Guerrero, female   DOB: 1950-07-01, 73 y.o.   MRN: [id number]  HPI: Cindy Guerrero is a 73 y.o.-year-old female, returning for follow-up for DM2, dx in 1998, non-insulin-dependent, uncontrolled, with complications (PN). Pt. previously saw Dr. Everardo All, but last visit with me 6 months ago. We used the [place] interpreter service for today's visit.  Interim history: No increased urination, blurry vision, nausea, chest pain.  She continues to exercise daily - 1h in am and 1h in the pm.  Reviewed HbA1c: Lab Results  Component Value Date   HGBA1C 7.5 (H) 03/27/2023   HGBA1C 7.2 (H) 12/23/2022   HGBA1C 9.2 09/09/2022   HGBA1C 9.5 (H) 04/26/2022   HGBA1C 8.0 (H) 10/16/2021   HGBA1C 8.0 (A) 06/01/2021   HGBA1C 6.0 (A) 06/02/2020   HGBA1C 6.1 (A) 12/03/2019   HGBA1C 6.3 (A) 05/13/2019   HGBA1C 6.2 (A) 01/13/2019   Pt is on a regimen of: - Metformin ER 1000 mg 2x a day, with meals >> AP in the evening >>  500 mg in am and 500 mg with dinner >> 1000 mg in a.m. and 500 mg with dinner - Actos 45 mg before breakfast - Jardiance 10 mg before breakfast >> actually in the pm  She is not checking CBGs - meter broken: - am: n/c >> 128-131 >> 109-126, 136 >>  - 2h after b'fast: n/c - before lunch: n/c - 2h after lunch: 178 >> n/c - before dinner: n/c - 2h after dinner: n/c >> 130-140 >> 121-131 - bedtime: n/c - nighttime: n/c Lowest sugar was  100 >> 109. Highest sugar was 130 >> 178 >> 131.  Glucometer: One Touch Verio  - no h/o CKD, last BUN/creatinine:  Lab Results  Component Value Date   BUN 29 (H) 03/27/2023   BUN 24 (H) 02/17/2023   CREATININE 0.97 03/27/2023   CREATININE 1.26 (H) 02/17/2023   Lab Results  Component Value Date   MICRALBCREAT 6.3 12/23/2022   MICRALBCREAT 24.7 10/16/2021  On losartan 25 mg daily.  -HL; last set of lipids: Lab Results  Component Value Date   CHOL 164 03/27/2023   HDL 67.50 03/27/2023   LDLCALC 79 03/27/2023   LDLDIRECT 53.0  04/26/2022   TRIG 89.0 03/27/2023   CHOLHDL 2 03/27/2023  On Crestor 10 mg daily and fenofibrate 160 mg daily.  - last eye exam was 04/26/2022.  + DR. She has a history of cataract surgery.  - + numbness and tingling in her feet.  Resolved occasional burning on soles - relieved by walking.  Last foot exam 05/03/2022.  She also has a history of osteoporosis, managed by PCP.  She is on Fosamax 70 mg weekly.  ROS: + see HPI  Past Medical History:  Diagnosis Date   Diabetes (HCC)    Dyslipidemia    Headache    Hypertension    Osteoporosis    Past Surgical History:  Procedure Laterality Date   EYE SURGERY     cataract surgery   Social History   Socioeconomic History   Marital status: Significant Other    Spouse name: Not on file   Number of children: 6   Years of education: Not on file   Highest education level: Not on file  Occupational History   Not on file  Tobacco Use   Smoking status: Never   Smokeless tobacco: Never  Vaping Use   Vaping status: Never Used  Substance and Sexual Activity   Alcohol use: No  Drug use: Never   Sexual activity: Not Currently  Other Topics Concern   Not on file  Social History Narrative   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   Social Drivers of Health   Financial Resource Strain: Low Risk  (10/14/2022)   Overall Financial Resource Strain (CARDIA)    Difficulty of Paying Living Expenses: Not hard at all  Food Insecurity: No Food Insecurity (10/14/2022)   Hunger Vital Sign    Worried About Running Out of Food in the Last Year: Never true    Ran Out of Food in the Last Year: Never true  Transportation Needs: No Transportation Needs (10/14/2022)   PRAPARE - Administrator, Civil Service (Medical): No    Lack of Transportation (Non-Medical): No  Physical Activity: Sufficiently Active (10/14/2022)   Exercise Vital Sign    Days of Exercise per Week: 7 days    Minutes of Exercise per Session: 60 min  Stress: No  Stress Concern Present (10/14/2022)   [employer] of Occupational Health - Occupational Stress Questionnaire    Feeling of Stress : Not at all  Social Connections: Moderately Isolated (10/14/2022)   Social Connection and Isolation Panel [NHANES]    Frequency of Communication with Friends and Family: More than three times a week    Frequency of Social Gatherings with Friends and Family: More than three times a week    Attends Religious Services: Never    Database administrator or Organizations: No    Attends Banker Meetings: Never    Marital Status: Living with partner  Intimate Partner Violence: Not At Risk (10/14/2022)   Humiliation, Afraid, Rape, and Kick questionnaire    Fear of Current or Ex-Partner: No    Emotionally Abused: No    Physically Abused: No    Sexually Abused: No   Current Outpatient Medications on File Prior to Visit  Medication Sig Dispense Refill   Acetaminophen (TYLENOL PO) Take by mouth as needed (pain).     alendronate (FOSAMAX) 70 MG tablet Take 1 tablet (70 mg total) by mouth once a week. Take with a full glass of water on an empty stomach. 12 tablet 3   B Complex Vitamins (B COMPLEX 1 PO) Take by mouth.     Blood Glucose Monitoring Suppl (ONETOUCH VERIO) w/Device KIT Use as advsed 1 kit 1   Calcium Carb-Cholecalciferol 600-800 MG-UNIT TABS Take by mouth.     Cyanocobalamin (VITAMIN B 12 PO) Take by mouth.     fenofibrate 160 MG tablet Take 1 tablet (160 mg total) by mouth daily. 90 tablet 3   glucose blood (ONETOUCH VERIO) test strip 1 each by Other route daily. And lancets 1/day.  E11.9 100 each 3   JARDIANCE 10 MG TABS tablet TAKE 1 TABLET BY MOUTH ONCE DAILY BEFORE BREAKFAST 90 tablet 0   Lancets (ONETOUCH DELICA PLUS LANCET33G) MISC USE TO CHECK GLUCOSE twice DAILY 200 each 3   losartan (COZAAR) 25 MG tablet Take 1 tablet (25 mg total) by mouth daily. 90 tablet 3   metFORMIN (GLUCOPHAGE-XR) 500 MG 24 hr tablet Take by mouth 2 tablets in am  and 1 tablet at night 270 tablet 3   omeprazole (PRILOSEC) 40 MG capsule Take 1 capsule (40 mg total) by mouth daily. 90 capsule 3   pioglitazone (ACTOS) 45 MG tablet Take 1 tablet (45 mg total) by mouth daily. 90 tablet 3   Pyridoxine HCl (B-6 PO) Take  1 tablet by mouth daily.     rosuvastatin (CRESTOR) 10 MG tablet Take 1 tablet (10 mg total) by mouth daily. 90 tablet 3   triamcinolone cream (KENALOG) 0.1 % Apply 1 Application topically 2 (two) times daily. 30 g 1   VITAMIN D PO Take 1 capsule by mouth daily.     vitamin E 1000 UNIT capsule Take 1,000 Units by mouth daily.     No current facility-administered medications on file prior to visit.   Allergies  Allergen Reactions   Atorvastatin Calcium     Other reaction(s): cough?  Other Reaction(s): cough?   Family History  Problem Relation Age of Onset   Diabetes Mother    Hyperlipidemia Daughter    Diabetes Daughter    Asthma Daughter    PE: BP 130/60   Pulse 85   Ht 4\' 11"  (1.499 m)   Wt 101 lb 3.2 oz (45.9 kg)   SpO2 98%   BMI 20.44 kg/m  Wt Readings from Last 3 Encounters:  04/04/23 101 lb 3.2 oz (45.9 kg)  03/14/23 101 lb 4 oz (45.9 kg)  02/19/23 101 lb 8 oz (46 kg)   Constitutional: normal weight, in NAD Eyes: no exophthalmos ENT: no thyromegaly, no cervical lymphadenopathy Cardiovascular: RRR, No MRG Respiratory: CTA B Musculoskeletal: no deformities Skin: no rashes Neurological: no tremor with outstretched hands Diabetic Foot Exam - Simple   Simple Foot Form Diabetic Foot exam was performed with the following findings: Yes 04/04/2023  9:20 AM  Visual Inspection No deformities, no ulcerations, no other skin breakdown bilaterally: Yes Sensation Testing Intact to touch and monofilament testing bilaterally: Yes Pulse Check Posterior Tibialis and Dorsalis pulse intact bilaterally: Yes Comments    ASSESSMENT: 1. DM2, non-insulin-dependent, uncontrolled, with complications - Peripheral neuropathy  2.   Hyperlipidemia  PLAN:  1. Patient with longstanding, uncontrolled, type 2 diabetes, on oral antidiabetic regimen with metformin, TZD, SGLT2 inhibitor, with all blood sugars at goal per review of her CBG log at last 2 visits.  She was checking twice a day.  She was also exercising consistently, 2 hours a day.  Surprisingly, at last visit, HbA1c was 9.2%, and when checked by fructosamine, the calculated HbA1c was still not correlating well with her blood sugars at home, at 8.0%.  I advised her to change her test strips at that time.  We continued the same regimen.  3 months later, and HbA1c actually returned 7.2%.  She had another HbA1c obtained 1 week ago and this was only slightly higher, at 7.5%. -At today's visit, she tells me that she was not able to check blood sugars lately as her glucometer is defective.  At today's visit I sent another prescription for the One Touch Verio meter with supplies to her pharmacy.  She needs to check 1-2 times a day.  Based on the HbA1c, I did not suggest a change in the regimen except for moving Jardiance before breakfast, since she currently takes it in the afternoon.  I refilled all of her diabetic prescriptions at today's visit. - I suggested to:  Patient Instructions  Please continue: - Metformin ER 1000 mg in am and 500 mg with dinner - Actos 45 mg before breakfast  Move: - Jardiance 10 mg before breakfast  Start checking blood sugars 1-2x a day.   Please return for another visit in 4 months.  - advised to check sugars at different times of the day - rotating check times - advised for yearly eye exams >>  she is UTD - return to clinic in 4 months  2.  Hyperlipidemia -Latest lipid panel was reviewed from 03/2023: LDL above target, otherwise fractions at goal: Lab Results  Component Value Date   CHOL 164 03/27/2023   HDL 67.50 03/27/2023   LDLCALC 79 03/27/2023   LDLDIRECT 53.0 04/26/2022   TRIG 89.0 03/27/2023   CHOLHDL 2 03/27/2023  -On Crestor  10 mg daily and fenofibrate 160 mg daily without side effects  Carlus Pavlov, MD PhD [facility] Endocrinology

## 2023-04-04 ENCOUNTER — Ambulatory Visit (INDEPENDENT_AMBULATORY_CARE_PROVIDER_SITE_OTHER): Payer: 59 | Admitting: Internal Medicine

## 2023-04-04 ENCOUNTER — Encounter: Payer: Self-pay | Admitting: Internal Medicine

## 2023-04-04 VITALS — BP 130/60 | HR 85 | Ht 59.0 in | Wt 101.2 lb

## 2023-04-04 DIAGNOSIS — Z7984 Long term (current) use of oral hypoglycemic drugs: Secondary | ICD-10-CM

## 2023-04-04 DIAGNOSIS — E1142 Type 2 diabetes mellitus with diabetic polyneuropathy: Secondary | ICD-10-CM | POA: Diagnosis not present

## 2023-04-04 DIAGNOSIS — E785 Hyperlipidemia, unspecified: Secondary | ICD-10-CM | POA: Diagnosis not present

## 2023-04-04 MED ORDER — METFORMIN HCL ER 500 MG PO TB24: ORAL_TABLET | ORAL | 3 refills | Status: DC

## 2023-04-04 MED ORDER — ONETOUCH VERIO VI STRP: 1.0000 | ORAL_STRIP | Freq: Every day | 3 refills | Status: DC

## 2023-04-04 MED ORDER — ONETOUCH VERIO W/DEVICE KIT: PACK | 1 refills | Status: AC

## 2023-04-04 MED ORDER — EMPAGLIFLOZIN 10 MG PO TABS: 10.0000 mg | ORAL_TABLET | Freq: Every day | ORAL | 3 refills | Status: AC

## 2023-04-04 MED ORDER — ONETOUCH DELICA PLUS LANCET33G MISC: 3 refills | Status: AC

## 2023-04-04 MED ORDER — PIOGLITAZONE HCL 45 MG PO TABS: 45.0000 mg | ORAL_TABLET | Freq: Every day | ORAL | 3 refills | Status: DC

## 2023-04-04 NOTE — Patient Instructions (Addendum)
Please continue: - Metformin ER 1000 mg in am and 500 mg with dinner - Actos 45 mg before breakfast  Move: - Jardiance 10 mg before breakfast  Start checking blood sugars 1-2x a day.   Please return for another visit in 4 months.

## 2023-08-14 ENCOUNTER — Ambulatory Visit: Payer: 59 | Admitting: Internal Medicine

## 2023-08-14 NOTE — Progress Notes (Deleted)
 Patient ID: Cindy Guerrero, female   DOB: November 30, 1950, 73 y.o.   MRN: [id number]  HPI: Cindy Guerrero is a 73 y.o.-year-old female, returning for follow-up for DM2, dx in 1998, non-insulin-dependent, uncontrolled, with complications (PN). Pt. previously saw Dr. Washington Hacker, but last visit with me 4.5 months ago. We used the [place] interpreter service for today's visit.  Interim history: No increased urination, blurry vision, nausea, chest pain.  She continues to exercise daily - 1h in am and 1h in the pm.  Reviewed HbA1c: Lab Results  Component Value Date   HGBA1C 7.5 (H) 03/27/2023   HGBA1C 7.2 (H) 12/23/2022   HGBA1C 9.2 09/09/2022   HGBA1C 9.5 (H) 04/26/2022   HGBA1C 8.0 (H) 10/16/2021   HGBA1C 8.0 (A) 06/01/2021   HGBA1C 6.0 (A) 06/02/2020   HGBA1C 6.1 (A) 12/03/2019   HGBA1C 6.3 (A) 05/13/2019   HGBA1C 6.2 (A) 01/13/2019   Pt is on a regimen of: - Metformin  ER 1000 mg 2x a day, with meals >> AP in the evening >>  500 mg in am and 500 mg with dinner >> 1000 mg in a.m. and 500 mg with dinner - Actos  45 mg before breakfast - Jardiance  10 mg before breakfast >> actually in the pm >> moved back to am  She was not checking blood sugars at last visit but she started to check 1-2 times a day: - am: n/c >> 128-131 >> 109-126, 136 >>  - 2h after b'fast: n/c - before lunch: n/c - 2h after lunch: 178 >> n/c - before dinner: n/c - 2h after dinner: n/c >> 130-140 >> 121-131 - bedtime: n/c - nighttime: n/c Lowest sugar was  100 >> 109. Highest sugar was 130 >> 178 >> 131.  Glucometer: One Touch Verio  - no h/o CKD, last BUN/creatinine:  Lab Results  Component Value Date   BUN 29 (H) 03/27/2023   BUN 24 (H) 02/17/2023   CREATININE 0.97 03/27/2023   CREATININE 1.26 (H) 02/17/2023   Lab Results  Component Value Date   MICRALBCREAT 6.3 12/23/2022   MICRALBCREAT 24.7 10/16/2021  On losartan  25 mg daily.  -HL; last set of lipids: Lab Results  Component Value Date   CHOL 164 03/27/2023    HDL 67.50 03/27/2023   LDLCALC 79 03/27/2023   LDLDIRECT 53.0 04/26/2022   TRIG 89.0 03/27/2023   CHOLHDL 2 03/27/2023  On Crestor  10 mg daily and fenofibrate  160 mg daily.  - last eye exam was 04/26/2022.  + DR. She has a history of cataract surgery.  - + numbness and tingling in her feet.  Resolved occasional burning on soles - relieved by walking.  Last foot exam 04/04/2023.  She also has a history of osteoporosis, managed by PCP.  She is on Fosamax  70 mg weekly.  ROS: + see HPI  Past Medical History:  Diagnosis Date   Diabetes (HCC)    Dyslipidemia    Headache    Hypertension    Osteoporosis    Past Surgical History:  Procedure Laterality Date   EYE SURGERY     cataract surgery   Social History   Socioeconomic History   Marital status: Significant Other    Spouse name: Not on file   Number of children: 6   Years of education: Not on file   Highest education level: Not on file  Occupational History   Not on file  Tobacco Use   Smoking status: Never   Smokeless tobacco: Never  Vaping Use  Vaping status: Never Used  Substance and Sexual Activity   Alcohol use: No   Drug use: Never   Sexual activity: Not Currently  Other Topics Concern   Not on file  Social History Narrative   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   Social Drivers of Health   Financial Resource Strain: Low Risk  (10/14/2022)   Overall Financial Resource Strain (CARDIA)    Difficulty of Paying Living Expenses: Not hard at all  Food Insecurity: No Food Insecurity (10/14/2022)   Hunger Vital Sign    Worried About Running Out of Food in the Last Year: Never true    Ran Out of Food in the Last Year: Never true  Transportation Needs: No Transportation Needs (10/14/2022)   PRAPARE - Administrator, Civil Service (Medical): No    Lack of Transportation (Non-Medical): No  Physical Activity: Sufficiently Active (10/14/2022)   Exercise Vital Sign    Days of Exercise per Week:  7 days    Minutes of Exercise per Session: 60 min  Stress: No Stress Concern Present (10/14/2022)   [employer] of Occupational Health - Occupational Stress Questionnaire    Feeling of Stress : Not at all  Social Connections: Moderately Isolated (10/14/2022)   Social Connection and Isolation Panel    Frequency of Communication with Friends and Family: More than three times a week    Frequency of Social Gatherings with Friends and Family: More than three times a week    Attends Religious Services: Never    Database administrator or Organizations: No    Attends Banker Meetings: Never    Marital Status: Living with partner  Intimate Partner Violence: Not At Risk (10/14/2022)   Humiliation, Afraid, Rape, and Kick questionnaire    Fear of Current or Ex-Partner: No    Emotionally Abused: No    Physically Abused: No    Sexually Abused: No   Current Outpatient Medications on File Prior to Visit  Medication Sig Dispense Refill   Acetaminophen (TYLENOL PO) Take by mouth as needed (pain).     alendronate  (FOSAMAX ) 70 MG tablet Take 1 tablet (70 mg total) by mouth once a week. Take with a full glass of water on an empty stomach. 12 tablet 3   B Complex Vitamins (B COMPLEX 1 PO) Take by mouth.     Blood Glucose Monitoring Suppl (ONETOUCH VERIO) w/Device KIT Use as advsed 1 kit 1   Calcium  Carb-Cholecalciferol 600-800 MG-UNIT TABS Take by mouth.     Cyanocobalamin  (VITAMIN B 12 PO) Take by mouth.     empagliflozin  (JARDIANCE ) 10 MG TABS tablet Take 1 tablet (10 mg total) by mouth daily before breakfast. 90 tablet 3   fenofibrate  160 MG tablet Take 1 tablet (160 mg total) by mouth daily. 90 tablet 3   glucose blood (ONETOUCH VERIO) test strip 1-2 each by Other route daily. And lancets 1/day.  E11.9 200 each 3   Lancets (ONETOUCH DELICA PLUS LANCET33G) MISC USE TO CHECK GLUCOSE 1-2x DAILY 200 each 3   losartan  (COZAAR ) 25 MG tablet Take 1 tablet (25 mg total) by mouth daily. 90  tablet 3   metFORMIN  (GLUCOPHAGE -XR) 500 MG 24 hr tablet Take by mouth 2 tablets in am and 1 tablet at night 270 tablet 3   omeprazole  (PRILOSEC) 40 MG capsule Take 1 capsule (40 mg total) by mouth daily. 90 capsule 3   pioglitazone  (ACTOS ) 45 MG tablet Take  1 tablet (45 mg total) by mouth daily. 90 tablet 3   Pyridoxine HCl (B-6 PO) Take 1 tablet by mouth daily.     rosuvastatin  (CRESTOR ) 10 MG tablet Take 1 tablet (10 mg total) by mouth daily. 90 tablet 3   triamcinolone  cream (KENALOG ) 0.1 % Apply 1 Application topically 2 (two) times daily. 30 g 1   VITAMIN D  PO Take 1 capsule by mouth daily.     vitamin E 1000 UNIT capsule Take 1,000 Units by mouth daily.     No current facility-administered medications on file prior to visit.   Allergies  Allergen Reactions   Atorvastatin Calcium      Other reaction(s): cough?  Other Reaction(s): cough?   Family History  Problem Relation Age of Onset   Diabetes Mother    Hyperlipidemia Daughter    Diabetes Daughter    Asthma Daughter    PE: There were no vitals taken for this visit. Wt Readings from Last 3 Encounters:  04/04/23 101 lb 3.2 oz (45.9 kg)  03/14/23 101 lb 4 oz (45.9 kg)  02/19/23 101 lb 8 oz (46 kg)   Constitutional: normal weight, in NAD Eyes: no exophthalmos ENT: no thyromegaly, no cervical lymphadenopathy Cardiovascular: RRR, No MRG Respiratory: CTA B Musculoskeletal: no deformities Skin: no rashes Neurological: no tremor with outstretched hands  ASSESSMENT: 1. DM2, non-insulin-dependent, uncontrolled, with complications - Peripheral neuropathy  2.  Hyperlipidemia  PLAN:  1. Patient with longstanding, uncontrolled type 2 diabetes, on oral antidiabetic regimen with metformin , TZD, and SGLT2 inhibitor, with still suboptimal control.  Latest HbA1c is from 5 months ago and this was 7.5%, slightly elevated.  She was not able to check blood sugars at that time as her glucometer was defective.  We sent another  prescription for a One Touch Verio meter and supplies to her pharmacy.  I advised her to check 1-2 times a day.  We moved Jardiance  before breakfast, as she was taking it in the afternoon and refilled her diabetic prescriptions but otherwise did not change her regimen.  - I suggested to:  Patient Instructions  Please continue: - Metformin  ER 1000 mg in am and 500 mg with dinner - Actos  45 mg before breakfast - Jardiance  10 mg before breakfast  Check blood sugars 1-2x a day.   Please return for another visit in 4 months.  - we checked her HbA1c: 7%  - advised to check sugars at different times of the day - 1x a day, rotating check times - advised for yearly eye exams >> she is UTD - return to clinic in 4 months  2.  Hyperlipidemia - Latest lipid panel from 03/2023 showed an LDL above target, increased from 2024, otherwise fractions at goal: Lab Results  Component Value Date   CHOL 164 03/27/2023   HDL 67.50 03/27/2023   LDLCALC 79 03/27/2023   LDLDIRECT 53.0 04/26/2022   TRIG 89.0 03/27/2023   CHOLHDL 2 03/27/2023  -On Crestor  10 mg daily and fenofibrate  160 mg daily without side effects  Emilie Harden, MD PhD [facility] Endocrinology

## 2023-09-12 ENCOUNTER — Ambulatory Visit: Payer: 59 | Admitting: Family Medicine

## 2023-10-20 ENCOUNTER — Ambulatory Visit: Payer: 59

## 2023-11-04 NOTE — Progress Notes (Addendum)
 Patient ID: Cindy Guerrero, female   DOB: [birth year], 73 y.o.   MRN: [id number]  HPI: Cindy Guerrero is a 73 y.o.-year-old female, returning for follow-up for DM2, dx in 1998, non-insulin -dependent, uncontrolled, with complications (PN). Pt. previously saw Dr. Kassie, but last visit with me 7.5 months ago. We used the [place] interpreter service for today's visit.  Interim history: No increased urination, blurry vision, nausea, chest pain. She has dry mouth. She continues to exercise daily - 1h in am and 1h in the pm. She has a cough.  Reviewed HbA1c: Lab Results  Component Value Date   HGBA1C 7.5 (H) 03/27/2023   HGBA1C 7.2 (H) 12/23/2022   HGBA1C 9.2 09/09/2022   HGBA1C 9.5 (H) 04/26/2022   HGBA1C 8.0 (H) 10/16/2021   HGBA1C 8.0 (A) 06/01/2021   HGBA1C 6.0 (A) 06/02/2020   HGBA1C 6.1 (A) 12/03/2019   HGBA1C 6.3 (A) 05/13/2019   HGBA1C 6.2 (A) 01/13/2019   Pt is on a regimen of: - Metformin  ER 1000 mg 2x a day, with meals >> AP in the evening >>  500 mg in am and 500 mg with dinner >> 1000 mg in a.m. and 500 mg with dinner - Actos  45 mg before breakfast - Jardiance  10 mg in the pm >> moved to the morning  She is checking CBGs: - am: n/c >> 128-131 >> 109-126, 136 >> 98-120, 130, 140 - 2h after b'fast: n/c - before lunch: n/c - 2h after lunch: 178 >> n/c - before dinner: n/c - 2h after dinner: n/c >> 130-140 >> 121-131 >> 130s - bedtime: n/c - nighttime: n/c Lowest sugar was  100 >> 109 >> 98. Highest sugar was 178 >> 131 >> 140.  Glucometer: One Touch Verio  - no h/o CKD, last BUN/creatinine:  Lab Results  Component Value Date   BUN 29 (H) 03/27/2023   BUN 24 (H) 02/17/2023   CREATININE 0.97 03/27/2023   CREATININE 1.26 (H) 02/17/2023   No results found for: MICRALBCREAT On losartan  25 mg daily.  -HL; last set of lipids: Lab Results  Component Value Date   CHOL 164 03/27/2023   HDL 67.50 03/27/2023   LDLCALC 79 03/27/2023   LDLDIRECT 53.0 04/26/2022   TRIG  89.0 03/27/2023   CHOLHDL 2 03/27/2023  On Crestor  10 mg daily and fenofibrate  160 mg daily.  - last eye exam was 04/26/2022.  + DR. She has a history of cataract surgery.  - + numbness and tingling in her feet - resolved occasional burning on soles - relieved by walking.  Last foot exam 04/04/2023.  She also has a history of osteoporosis, managed by PCP.  She is on Fosamax  70 mg weekly.  ROS: + see HPI  Past Medical History:  Diagnosis Date   Diabetes (HCC)    Dyslipidemia    Headache    Hypertension    Osteoporosis    Past Surgical History:  Procedure Laterality Date   EYE SURGERY     cataract surgery   Social History   Socioeconomic History   Marital status: Significant Other    Spouse name: Not on file   Number of children: 6   Years of education: Not on file   Highest education level: Not on file  Occupational History   Not on file  Tobacco Use   Smoking status: Never   Smokeless tobacco: Never  Vaping Use   Vaping status: Never Used  Substance and Sexual Activity   Alcohol use: No   Drug  use: Never   Sexual activity: Not Currently  Other Topics Concern   Not on file  Social History Narrative   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   Social Drivers of Health   Financial Resource Strain: Low Risk  (10/14/2022)   Overall Financial Resource Strain (CARDIA)    Difficulty of Paying Living Expenses: Not hard at all  Food Insecurity: No Food Insecurity (10/14/2022)   Hunger Vital Sign    Worried About Running Out of Food in the Last Year: Never true    Ran Out of Food in the Last Year: Never true  Transportation Needs: No Transportation Needs (10/14/2022)   PRAPARE - Administrator, Civil Service (Medical): No    Lack of Transportation (Non-Medical): No  Physical Activity: Sufficiently Active (10/14/2022)   Exercise Vital Sign    Days of Exercise per Week: 7 days    Minutes of Exercise per Session: 60 min  Stress: No Stress Concern Present  (10/14/2022)   [employer] of Occupational Health - Occupational Stress Questionnaire    Feeling of Stress : Not at all  Social Connections: Moderately Isolated (10/14/2022)   Social Connection and Isolation Panel    Frequency of Communication with Friends and Family: More than three times a week    Frequency of Social Gatherings with Friends and Family: More than three times a week    Attends Religious Services: Never    Database administrator or Organizations: No    Attends Banker Meetings: Never    Marital Status: Living with partner  Intimate Partner Violence: Not At Risk (10/14/2022)   Humiliation, Afraid, Rape, and Kick questionnaire    Fear of Current or Ex-Partner: No    Emotionally Abused: No    Physically Abused: No    Sexually Abused: No   Current Outpatient Medications on File Prior to Visit  Medication Sig Dispense Refill   Acetaminophen (TYLENOL PO) Take by mouth as needed (pain).     alendronate  (FOSAMAX ) 70 MG tablet Take 1 tablet (70 mg total) by mouth once a week. Take with a full glass of water on an empty stomach. 12 tablet 3   B Complex Vitamins (B COMPLEX 1 PO) Take by mouth.     Blood Glucose Monitoring Suppl (ONETOUCH VERIO) w/Device KIT Use as advsed 1 kit 1   Calcium  Carb-Cholecalciferol 600-800 MG-UNIT TABS Take by mouth.     Cyanocobalamin  (VITAMIN B 12 PO) Take by mouth.     empagliflozin  (JARDIANCE ) 10 MG TABS tablet Take 1 tablet (10 mg total) by mouth daily before breakfast. 90 tablet 3   fenofibrate  160 MG tablet Take 1 tablet (160 mg total) by mouth daily. 90 tablet 3   glucose blood (ONETOUCH VERIO) test strip 1-2 each by Other route daily. And lancets 1/day.  E11.9 200 each 3   Lancets (ONETOUCH DELICA PLUS LANCET33G) MISC USE TO CHECK GLUCOSE 1-2x DAILY 200 each 3   losartan  (COZAAR ) 25 MG tablet Take 1 tablet (25 mg total) by mouth daily. 90 tablet 3   metFORMIN  (GLUCOPHAGE -XR) 500 MG 24 hr tablet Take by mouth 2 tablets in am and  1 tablet at night 270 tablet 3   omeprazole  (PRILOSEC) 40 MG capsule Take 1 capsule (40 mg total) by mouth daily. 90 capsule 3   pioglitazone  (ACTOS ) 45 MG tablet Take 1 tablet (45 mg total) by mouth daily. 90 tablet 3   Pyridoxine HCl (B-6 PO)  Take 1 tablet by mouth daily.     rosuvastatin  (CRESTOR ) 10 MG tablet Take 1 tablet (10 mg total) by mouth daily. 90 tablet 3   triamcinolone  cream (KENALOG ) 0.1 % Apply 1 Application topically 2 (two) times daily. 30 g 1   VITAMIN D  PO Take 1 capsule by mouth daily.     vitamin E 1000 UNIT capsule Take 1,000 Units by mouth daily.     No current facility-administered medications on file prior to visit.   Allergies  Allergen Reactions   Atorvastatin Calcium      Other reaction(s): cough?  Other Reaction(s): cough?   Family History  Problem Relation Age of Onset   Diabetes Mother    Hyperlipidemia Daughter    Diabetes Daughter    Asthma Daughter    PE: BP 120/70   Pulse 74   Ht 4' 11 (1.499 m)   Wt 101 lb 9.6 oz (46.1 kg)   SpO2 97%   BMI 20.52 kg/m  Wt Readings from Last 3 Encounters:  11/05/23 101 lb 9.6 oz (46.1 kg)  04/04/23 101 lb 3.2 oz (45.9 kg)  03/14/23 101 lb 4 oz (45.9 kg)   Constitutional: normal weight, in NAD Eyes: no exophthalmos ENT: no thyromegaly, no cervical lymphadenopathy Cardiovascular: RRR, No MRG Respiratory: CTA B Musculoskeletal: no deformities Skin: no rashes Neurological: no tremor with outstretched hands  ASSESSMENT: 1. DM2, non-insulin -dependent, uncontrolled, with complications - Peripheral neuropathy  2.  Hyperlipidemia  PLAN:  1. Patient with history of uncontrolled, type 2 diabetes, on oral and diabetic regimen with metformin , TZD, SGLT2 inhibitor, and increasing HbA1c at last visit, at 7.5%.  She was not able to check blood sugars as her glucometer was defective.  I sent another prescription for the One Touch Verio meter with supplies to her pharmacy and advised her to start checking 1-2  times a day.  Based on the HbA1c, I did not suggest a change in regimen except for moving Jardiance  before breakfast as she was taking it in the afternoon.  I refilled all of her diabetic prescriptions at that time. - At today's visit, her sugars appear to have improved at home, and they are mostly at goal reportedly.  However, the HbA1c returned 5% higher than expected.  She does not have symptoms of glucotoxicity except for dry mouth.  We checked her fingerstick blood sugar and this was 186 (fasting).  It is plausible that this increases significantly when she starts eating.  I advised her to stop Jardiance  for now to avoid euglycemic DKA in the absence of insulin .  I will send her to the lab to recheck an HbA1c and a fructosamine level.  I advised her to get new test strips.  I also advised her to start checking 4 times a day before meals and at bedtime and to send these to me in a week.  In the meantime, I recommended the CGM and explained how to use it.  We will also check her insulin  production and antifungal attic antibodies at today's visit.  For now, advised her to continue metformin  and Actos  until we get more information from the above investigation.  We may need to start insulin  after these results return. - I suggested to:  Patient Instructions  Please continue: - Metformin  ER 1000 mg in am and 500 mg with dinner - Actos  45 mg before breakfast  Hold: - Jardiance    Please stop at the lab.  Change the test strips.  Check sugars 4x a  day, before meals and at bedtime and send these to me in a week.  Try to get the glucose sensor.   Please return for another visit in 1 month.  - we checked her HbA1c: 12% (NOT explained by her sugars at home) - advised to check sugars at different times of the day - 1-2 x a day, rotating check times - advised for yearly eye exams >> she is not UTD - advised to schedule - will check an ACR at today's visit. - return to clinic in 1 month  2.   Hyperlipidemia - Latest lipid panel was reviewed from 03/2023: LDL above target, otherwise fractions at goal: Lab Results  Component Value Date   CHOL 164 03/27/2023   HDL 67.50 03/27/2023   LDLCALC 79 03/27/2023   LDLDIRECT 53.0 04/26/2022   TRIG 89.0 03/27/2023   CHOLHDL 2 03/27/2023  - On Crestor  10 mg daily and fenofibric 160 mg daily without side effects  Orders Placed This Encounter  Procedures   Microalbumin / creatinine urine ratio   C-peptide   IA-2 Antibody   ZNT8 Antibodies   Glutamic acid decarboxylase auto abs   Glucose, fasting   Hemoglobin A1c   Fructosamine   POCT glycosylated hemoglobin (Hb A1C)   POCT glucose (manual entry)   Component     Latest Ref Rng 11/05/2023  Hemoglobin A1C     <5.7 % 11.8 (H)   Hemoglobin A1C      12.0 !   Glucose     65 - 99 mg/dL 824 (H)   POC Glucose     70 - 99 mg/dl 813 !   Fructosamine     205 - 285 umol/L 442 (H)   C-Peptide     0.80 - 3.85 ng/mL 1.10   IA-2 Antibody     <5.4 U/mL <5.4   ZNT8 Antibodies     <15 U/mL <10   Glutamic Acid Decarb Ab     <5 IU/mL <5   Creatinine, Urine     20 - 275 mg/dL 43   Microalb, Ur     mg/dL 3.5   MICROALB/CREAT RATIO     <30 mg/g creat 81 (H)   Mean Plasma Glucose     mg/dL 707   eAG (mmol/L)     mmol/L 16.2     Venous HBA1c confirms the point-of-care value.  HbA1c calculated from fructosamine is 9.12%, lower than the directly measured HbA1c.  I believe that her test trips or meter are defective.  I already advised her to change her test trips.  A freestyle libre 3 CGM was declined by the insurance because she is not on insulin .  However, based on the elevated HbA1c and fructosamine, we will go ahead and start a low-dose of basal insulin , 6 units in am and may need to increase the dose based on blood sugars. Her C-peptide is actually low normal and antipancreatic antibodies are not elevated.  As of now, we do not have a clear diagnosis of type 1 diabetes, but would still  suggest to start a low-dose insulin .  We most likely will be able to restart Jardiance  at next visit if the sugars improved. ACR is elevated, most likely in the setting of poor glycemic control.  Lela Fendt, MD PhD [facility] Endocr[facility]

## 2023-11-05 ENCOUNTER — Encounter: Payer: Self-pay | Admitting: Internal Medicine

## 2023-11-05 ENCOUNTER — Telehealth: Payer: Self-pay

## 2023-11-05 ENCOUNTER — Ambulatory Visit (INDEPENDENT_AMBULATORY_CARE_PROVIDER_SITE_OTHER): Admitting: Internal Medicine

## 2023-11-05 ENCOUNTER — Other Ambulatory Visit (HOSPITAL_COMMUNITY): Payer: Self-pay

## 2023-11-05 VITALS — BP 120/70 | HR 74 | Ht 59.0 in | Wt 101.6 lb

## 2023-11-05 DIAGNOSIS — Z7984 Long term (current) use of oral hypoglycemic drugs: Secondary | ICD-10-CM | POA: Diagnosis not present

## 2023-11-05 DIAGNOSIS — E1142 Type 2 diabetes mellitus with diabetic polyneuropathy: Secondary | ICD-10-CM

## 2023-11-05 DIAGNOSIS — E785 Hyperlipidemia, unspecified: Secondary | ICD-10-CM

## 2023-11-05 LAB — POCT GLYCOSYLATED HEMOGLOBIN (HGB A1C): Hemoglobin A1C: 12 % — AB (ref 4.0–5.6)

## 2023-11-05 LAB — GLUCOSE, POCT (MANUAL RESULT ENTRY): POC Glucose: 186 mg/dL — AB (ref 70–99)

## 2023-11-05 MED ORDER — FREESTYLE LIBRE 3 PLUS SENSOR MISC: 1.0000 | 3 refills | Status: AC

## 2023-11-05 MED ORDER — FREESTYLE LIBRE 3 READER DEVI: 1.0000 | Freq: Once | 0 refills | Status: AC

## 2023-11-05 NOTE — Telephone Encounter (Signed)
 Pharmacy Patient Advocate Encounter   Received notification from CoverMyMeds that prior authorization for Freestyle libre 3 plus sensor is required/requested.   Insurance verification completed.   The patient is insured through [facility] .   Per test claim: PA required; PA started via CoverMyMeds. KEY [id number] . Please see clinical question(s) below that I am not finding the answer to in their chart and advise.     PA will be denied if pt is not using insulin , nor has a history of sever hypoglycemia. I do not see either in her chart.

## 2023-11-05 NOTE — Patient Instructions (Addendum)
 Please continue: - Metformin  ER 1000 mg in am and 500 mg with dinner - Actos  45 mg before breakfast  Hold: - Jardiance    Please stop at the lab.  Change the test strips.  Check sugars 4x a day, before meals and at bedtime and send these to me in a week.  Try to get the glucose sensor.   Please return for another visit in 1 month.

## 2023-11-07 ENCOUNTER — Ambulatory Visit: Payer: Self-pay | Admitting: Internal Medicine

## 2023-11-07 MED ORDER — LANTUS SOLOSTAR 100 UNIT/ML ~~LOC~~ SOPN
6.0000 [IU] | PEN_INJECTOR | Freq: Every day | SUBCUTANEOUS | Status: AC
Start: 2023-11-07 — End: ?

## 2023-11-07 MED ORDER — INSULIN PEN NEEDLE 32G X 4 MM MISC: Status: AC

## 2023-11-07 NOTE — Addendum Note (Signed)
 Addended by: TRIXIE FILE on: 11/07/2023 02:58 PM   Modules accepted: Orders

## 2023-11-13 LAB — HEMOGLOBIN A1C
Hgb A1c MFr Bld: 11.8 % — ABNORMAL HIGH (ref <5.7)
Mean Plasma Glucose: 292 mg/dL
eAG (mmol/L): 16.2 mmol/L

## 2023-11-13 LAB — MICROALBUMIN / CREATININE URINE RATIO
Creatinine, Urine: 43 mg/dL (ref 20–275)
Microalb Creat Ratio: 81 mg/g{creat} — ABNORMAL HIGH (ref <30)
Microalb, Ur: 3.5 mg/dL

## 2023-11-13 LAB — C-PEPTIDE: C-Peptide: 1.1 ng/mL (ref 0.80–3.85)

## 2023-11-13 LAB — FRUCTOSAMINE: Fructosamine: 442 umol/L — ABNORMAL HIGH (ref 205–285)

## 2023-11-13 LAB — ZNT8 ANTIBODIES: ZNT8 Antibodies: <10 U/mL (ref <15)

## 2023-11-13 LAB — GLUTAMIC ACID DECARBOXYLASE AUTO ABS: Glutamic Acid Decarb Ab: <5 [IU]/mL (ref <5)

## 2023-11-13 LAB — GLUCOSE, FASTING: Glucose, Bld: 175 mg/dL — ABNORMAL HIGH (ref 65–99)

## 2023-11-13 LAB — IA-2 ANTIBODY: IA-2 Antibody: <5.4 U/mL (ref <5.4)

## 2023-11-17 NOTE — Telephone Encounter (Signed)
 PA resubmitted. Key: [id number]

## 2023-11-21 NOTE — Telephone Encounter (Signed)
 Pharmacy Patient Advocate Encounter  Received notification from [employer] that Prior Authorization for [facility] 3 plus sensor has been APPROVED from 11/17/23 to 03/03/2024   PA #/Case ID/Reference #: [id number]

## 2023-11-24 ENCOUNTER — Encounter: Payer: Self-pay | Admitting: Family Medicine

## 2023-11-24 ENCOUNTER — Ambulatory Visit: Payer: Self-pay | Admitting: Family Medicine

## 2023-11-24 ENCOUNTER — Ambulatory Visit (INDEPENDENT_AMBULATORY_CARE_PROVIDER_SITE_OTHER): Admitting: Family Medicine

## 2023-11-24 VITALS — BP 136/69 | HR 84 | Temp 97.5°F | Resp 16 | Ht 59.0 in | Wt 102.5 lb

## 2023-11-24 DIAGNOSIS — Z23 Encounter for immunization: Secondary | ICD-10-CM

## 2023-11-24 DIAGNOSIS — M816 Localized osteoporosis [Lequesne]: Secondary | ICD-10-CM | POA: Diagnosis not present

## 2023-11-24 DIAGNOSIS — E785 Hyperlipidemia, unspecified: Secondary | ICD-10-CM

## 2023-11-24 DIAGNOSIS — Z789 Other specified health status: Secondary | ICD-10-CM | POA: Diagnosis not present

## 2023-11-24 DIAGNOSIS — Z0000 Encounter for general adult medical examination without abnormal findings: Secondary | ICD-10-CM

## 2023-11-24 DIAGNOSIS — I10 Essential (primary) hypertension: Secondary | ICD-10-CM

## 2023-11-24 DIAGNOSIS — E1142 Type 2 diabetes mellitus with diabetic polyneuropathy: Secondary | ICD-10-CM | POA: Diagnosis not present

## 2023-11-24 DIAGNOSIS — E1169 Type 2 diabetes mellitus with other specified complication: Secondary | ICD-10-CM

## 2023-11-24 DIAGNOSIS — Z7984 Long term (current) use of oral hypoglycemic drugs: Secondary | ICD-10-CM

## 2023-11-24 LAB — CBC WITH DIFFERENTIAL/PLATELET
Basophils Absolute: 0.1 K/uL (ref 0.0–0.1)
Basophils Relative: 0.8 % (ref 0.0–3.0)
Eosinophils Absolute: 0.2 K/uL (ref 0.0–0.7)
Eosinophils Relative: 2.1 % (ref 0.0–5.0)
HCT: 36.9 % (ref 36.0–46.0)
Hemoglobin: 12 g/dL (ref 12.0–15.0)
Lymphocytes Relative: 34.9 % (ref 12.0–46.0)
Lymphs Abs: 2.4 K/uL (ref 0.7–4.0)
MCHC: 32.6 g/dL (ref 30.0–36.0)
MCV: 78.2 fl (ref 78.0–100.0)
Monocytes Absolute: 0.4 K/uL (ref 0.1–1.0)
Monocytes Relative: 5.9 % (ref 3.0–12.0)
Neutro Abs: 4 K/uL (ref 1.4–7.7)
Neutrophils Relative %: 56.3 % (ref 43.0–77.0)
Platelets: 252 K/uL (ref 150.0–400.0)
RBC: 4.72 Mil/uL (ref 3.87–5.11)
RDW: 14.1 % (ref 11.5–15.5)
WBC: 7 K/uL (ref 4.0–10.5)

## 2023-11-24 LAB — COMPREHENSIVE METABOLIC PANEL WITH GFR
ALT: 16 U/L (ref 0–35)
AST: 20 U/L (ref 0–37)
Albumin: 4.7 g/dL (ref 3.5–5.2)
Alkaline Phosphatase: 53 U/L (ref 39–117)
BUN: 19 mg/dL (ref 6–23)
CO2: 28 meq/L (ref 19–32)
Calcium: 9.9 mg/dL (ref 8.4–10.5)
Chloride: 103 meq/L (ref 96–112)
Creatinine, Ser: 0.84 mg/dL (ref 0.40–1.20)
GFR: 68.37 mL/min (ref >60.00)
Glucose, Bld: 148 mg/dL — ABNORMAL HIGH (ref 70–99)
Potassium: 4.4 meq/L (ref 3.5–5.1)
Sodium: 139 meq/L (ref 135–145)
Total Bilirubin: 0.4 mg/dL (ref 0.2–1.2)
Total Protein: 7.8 g/dL (ref 6.0–8.3)

## 2023-11-24 LAB — LIPID PANEL
Cholesterol: 136 mg/dL (ref 0–200)
HDL: 50 mg/dL (ref >39.00)
LDL Cholesterol: 35 mg/dL (ref 0–99)
NonHDL: 86.15
Total CHOL/HDL Ratio: 3
Triglycerides: 258 mg/dL — ABNORMAL HIGH (ref 0.0–149.0)
VLDL: 51.6 mg/dL — ABNORMAL HIGH (ref 0.0–40.0)

## 2023-11-24 LAB — TSH: TSH: 2.67 u[IU]/mL (ref 0.35–5.50)

## 2023-11-24 MED ORDER — FENOFIBRATE 160 MG PO TABS: 160.0000 mg | ORAL_TABLET | Freq: Every day | ORAL | 3 refills | Status: AC

## 2023-11-24 MED ORDER — ALENDRONATE SODIUM 70 MG PO TABS: 70.0000 mg | ORAL_TABLET | ORAL | 3 refills | Status: AC

## 2023-11-24 MED ORDER — LOSARTAN POTASSIUM 25 MG PO TABS: 25.0000 mg | ORAL_TABLET | Freq: Every day | ORAL | 3 refills | Status: AC

## 2023-11-24 MED ORDER — FLUCONAZOLE 150 MG PO TABS: 150.0000 mg | ORAL_TABLET | ORAL | 0 refills | Status: AC | PRN

## 2023-11-24 MED ORDER — ROSUVASTATIN CALCIUM 10 MG PO TABS: 10.0000 mg | ORAL_TABLET | Freq: Every day | ORAL | 3 refills | Status: AC

## 2023-11-24 MED ORDER — OMEPRAZOLE 40 MG PO CPDR: 40.0000 mg | DELAYED_RELEASE_CAPSULE | Freq: Every day | ORAL | 3 refills | Status: AC

## 2023-11-24 NOTE — Progress Notes (Signed)
 Ok.  Cindy Guerrero a little high-get back on meds

## 2023-11-24 NOTE — Progress Notes (Signed)
 Phone [phone]   Subjective:   Patient is a 73 y.o. female presenting for annual physical.    Chief Complaint  Patient presents with   Annual Exam    CPE Fasting     Annual-on-line interpreter Vy [phone].  Exercises Discussed the use of AI scribe software for clinical note transcription with the patient, who gave verbal consent to proceed.  History of Present Illness Cindy Guerrero is a 73 year old female who presents for an annual physical exam.  She has a history of diabetes and was seen by her diabetes doctor about ten days ago. There was an attempt to by Dr. Trixie to contact her about her diabetes earlier this month to discuss starting insulin , but it is unclear if this communication was successful. She has not been informed about any changes to her medication regimen following her last visit. She is currently on Jardiance  10mg ,metformin  1500mg , actos  45mg   for her diabetes and needs more supplies for her blood glucose monitoring, including lancets and test strips, as she has run out.  She has run out of her cholesterol medications for 1 wk, fenofibrate  160 mg and rosuvastatin  10 mg, and needs a refill. She is also taking over-the-counter calcium  and vitamin D  supplements.  Htn-taking losartan  25mg  daily  She experiences occasional coughing, which she describes as having phlegm in her throat. She is unsure about which over-the-counter medications are safe to use due to her diabetes. No chest pain, swelling in the legs, or soreness, and she exercises every day.  She reports experiencing itchiness during urination, which she describes as 'itchy inside'. She has used a feminine wash which provided some relief. No symptoms of depression, stomach pain, vomiting, diarrhea, or trouble urinating beyond the itchiness. She has not experienced any headaches or dizziness recently.  Osteoporosis-on fosamax  70mg  weekly-ran out    See problem oriented charting- ROS- ROS: Gen: no fever,  chills  Skin: no rash, itching ENT: no ear pain, ear drainage, nasal congestion, rhinorrhea, sinus pressure, sore throat Eyes: no blurry vision, double vision Resp: no  wheeze,SOB CV: no CP, palpitations, LE edema,  GI: no heartburn, n/v/d/c, abd pain GU: no dysuria, urgency, frequency, hematuria.  Some vaginal itch. MSK: no joint pain, myalgias, back pain Neuro: no dizziness, headache, weakness, vertigo Psych: no depression, anxiety, insomnia, SI   The following were reviewed and entered/updated in epic: Past Medical History:  Diagnosis Date   Diabetes (HCC)    Dyslipidemia    Headache    Hypertension    Osteoporosis    Patient Active Problem List   Diagnosis Date Noted   Primary hypertension 03/15/2023   Intrinsic eczema 03/15/2023   Type 2 diabetes mellitus with hyperglycemia, without long-term current use of insulin  (HCC)    Dyslipidemia    Headache    Osteoporosis    Past Surgical History:  Procedure Laterality Date   EYE SURGERY     cataract surgery    Family History  Problem Relation Age of Onset   Diabetes Mother    Hyperlipidemia Daughter    Diabetes Daughter    Asthma Daughter     Medications- reviewed and updated Current Outpatient Medications  Medication Sig Dispense Refill   Acetaminophen (TYLENOL PO) Take by mouth as needed (pain).     B Complex Vitamins (B COMPLEX 1 PO) Take by mouth.     Blood Glucose Monitoring Suppl (ONETOUCH VERIO) w/Device KIT Use as advsed 1 kit 1   Calcium  Carb-Cholecalciferol 600-800 MG-UNIT TABS Take  by mouth.     Continuous Glucose Sensor (FREESTYLE LIBRE 3 PLUS SENSOR) MISC 1 each by Does not apply route every 14 (fourteen) days. 6 each 3   Cyanocobalamin  (VITAMIN B 12 PO) Take by mouth.     empagliflozin  (JARDIANCE ) 10 MG TABS tablet Take 1 tablet (10 mg total) by mouth daily before breakfast. 90 tablet 3   fluconazole  (DIFLUCAN ) 150 MG tablet Take 1 tablet (150 mg total) by mouth every three (3) days as needed. 2  tablet 0   glucose blood (ONETOUCH VERIO) test strip 1-2 each by Other route daily. And lancets 1/day.  E11.9 200 each 3   insulin  glargine (LANTUS  SOLOSTAR) 100 UNIT/ML Solostar Pen Inject 6 Units into the skin daily.     Insulin  Pen Needle 32G X 4 MM MISC Use 1x a day     Lancets (ONETOUCH DELICA PLUS LANCET33G) MISC USE TO CHECK GLUCOSE 1-2x DAILY 200 each 3   metFORMIN  (GLUCOPHAGE -XR) 500 MG 24 hr tablet Take by mouth 2 tablets in am and 1 tablet at night 270 tablet 3   pioglitazone  (ACTOS ) 45 MG tablet Take 1 tablet (45 mg total) by mouth daily. 90 tablet 3   Pyridoxine HCl (B-6 PO) Take 1 tablet by mouth daily.     triamcinolone  cream (KENALOG ) 0.1 % Apply 1 Application topically 2 (two) times daily. 30 g 1   VITAMIN D  PO Take 1 capsule by mouth daily.     vitamin E 1000 UNIT capsule Take 1,000 Units by mouth daily.     alendronate  (FOSAMAX ) 70 MG tablet Take 1 tablet (70 mg total) by mouth once a week. Take with a full glass of water on an empty stomach. 12 tablet 3   fenofibrate  160 MG tablet Take 1 tablet (160 mg total) by mouth daily. 90 tablet 3   losartan  (COZAAR ) 25 MG tablet Take 1 tablet (25 mg total) by mouth daily. 90 tablet 3   omeprazole  (PRILOSEC) 40 MG capsule Take 1 capsule (40 mg total) by mouth daily. 90 capsule 3   rosuvastatin  (CRESTOR ) 10 MG tablet Take 1 tablet (10 mg total) by mouth daily. 90 tablet 3   No current facility-administered medications for this visit.    Allergies-reviewed and updated Allergies  Allergen Reactions   Atorvastatin Calcium      Other reaction(s): cough?  Other Reaction(s): cough?    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 DP 2+B LUNGS: CTAB. No wheezes ABDOMEN:  BS+, soft, NTND, No HSM, no masses EXT:  no edema MSK: no gross abnormalities. MS 5/5 all 4 NEURO: A&O x3.  CN II-XII intact.  PSYCH: normal mood. Good eye contact   Reviewed notes from endo and labs    Assessment and Plan   Health Maintenance counseling: 1. Anticipatory guidance: Patient counseled regarding regular dental exams q6 months, eye exams,  avoiding smoking and second hand smoke, limiting alcohol to 1 beverage per day,  no illicit drugs.   2. Risk factor reduction:  Advised patient of need for regular exercise and diet rich and fruits and vegetables to reduce risk of heart attack and stroke. Exercise- daily.  Wt Readings from Last 3 Encounters:  11/24/23 102 lb 8 oz (46.5 kg)  11/05/23 101 lb 9.6 oz (46.1 kg)  04/04/23 101 lb 3.2 oz (45.9 kg)   3. Immunizations/screenings/ancillary studies Immunization History  Administered Date(s) Administered   Fluad Quad(high Dose 65+) 01/13/2019, 12/03/2019   Fluzone Influenza virus vaccine,trivalent (IIV3), split virus 11/02/2013   Hepatitis B 03/31/1996   INFLUENZA, HIGH DOSE SEASONAL PF 12/11/2022, 11/24/2023   Influenza Split 02/12/2006   Influenza,inj,Quad PF,6+ Mos 11/11/2016   Influenza,inj,quad, With Preservative 11/09/2015   Influenza-Unspecified 10/27/2014, 11/20/2017, 10/24/2021   PFIZER(Purple Top)SARS-COV-2 Vaccination 05/16/2019, 06/07/2019, 12/19/2019   PNEUMOCOCCAL CONJUGATE-20 10/24/2021   Pneumococcal Conjugate-13 01/30/2015   Pneumococcal Polysaccharide-23 11/09/2015   Td 09/12/1995, 03/31/1996   Tdap 11/02/2013, 01/08/2018   Zoster Recombinant(Shingrix) 10/24/2021   Zoster, Live 11/02/2013   Zoster, Unspecified 10/24/2021   Health Maintenance Due  Topic Date Due   OPHTHALMOLOGY EXAM  04/27/2023   Medicare Annual Wellness (AWV)   10/14/2023    4. Cervical cancer screening- age 67. Breast cancer screening-  mammogram due in January 6. Colon cancer screening - 02/07/25 7. Skin cancer screening- advised regular sunscreen use. Denies worrisome, changing, or new skin lesions.  8. Birth control/STD check- post men 65. Osteoporosis screening- utd 10. Smoking associated screening - non smoker  Wellness examination  Type 2 diabetes mellitus with diabetic polyneuropathy, without long-term current use of insulin  (HCC) -     TSH  Primary hypertension -     TSH -     Losartan  Potassium; Take 1 tablet (25 mg total) by mouth daily.  Dispense: 90 tablet; Refill: 3 -     CBC with Differential/Platelet  Hyperlipidemia associated with type 2 diabetes mellitus (HCC) -     Comprehensive metabolic panel with GFR -     Lipid panel -     TSH -     Fenofibrate ; Take 1 tablet (160 mg total) by mouth daily.  Dispense: 90 tablet; Refill: 3 -     Rosuvastatin  Calcium ; Take 1 tablet (10 mg total) by mouth daily.  Dispense: 90 tablet; Refill: 3  Localized osteoporosis without current pathological fracture -     TSH -     Alendronate  Sodium; Take 1 tablet (70 mg total) by mouth once a week. Take with a full glass of water on an empty stomach.  Dispense: 12 tablet; Refill: 3  Need for influenza vaccination -     Flu vaccine HIGH DOSE PF(Fluzone Trivalent)  Long term current use of oral hypoglycemic drug  Language barrier to communication  Other orders -     Omeprazole ; Take 1 capsule (40 mg total) by mouth daily.  Dispense: 90 capsule; Refill: 3 -     Fluconazole ; Take 1 tablet (150 mg total) by mouth every three (3) days as needed.  Dispense: 2 tablet; Refill: 0   Annual-antic guidance Assessment and Plan Assessment & Plan Adult Wellness Visit   Routine adult wellness visit with no acute concerns. She maintains a regular exercise routine. Perform blood tests and encourage continuation of regular exercise.  Type 2 diabetes  mellitus with polyneuropathy and other specified complications   not controlled w/hyperglycemia.  It is unclear if insulin  initiation was communicated. She requires blood glucose monitoring supplies. Occasional cough is present; advised  on sugar-free options due to diabetes. Contact the endocrinologist for insulin  initiation confirmation. Obtain blood glucose monitoring supplies from the pharmacy. Use Mucinex DM or sugar-free cough syrup for the cough. Needs to contact endo and sch ophth  Essential hypertension-chronic.  Controlled.  Continue losartan  25mg   HLD-chronic.  Controlled.  Continue fenofibrate  and rosuvastatin   Localized osteoporosis   She completed her medication course and requires a refill. fosamax   Recurrent vulvovaginal pruritus   She was advised on hygiene measures to prevent recurrence. Prescribe a single-dose pill for pruritus and advise her to dry thoroughly after urination. Suspect yeast from Jardiance     Recommended follow up: Return in about 6 months (around 05/23/2024) for chronic follow-up.  Lab/Order associations:+ fasting  Jenkins CHRISTELLA Carrel, MD

## 2023-11-24 NOTE — Patient Instructions (Addendum)
 It was very nice to see you today!  [phone] for mammogram, due end of January   Mucinex DM Robitussin for diabetes. Sugar free.  Sugar free cough drops  Call Dr. Trixie  See eye doctor   PLEASE NOTE:  If you had any lab tests please let us  know if you have not heard back within a few days. You may see your results on MyChart before we have a chance to review them but we will give you a call once they are reviewed by us . If we ordered any referrals today, please let us  know if you have not heard from their office within the next week.   Please try these tips to maintain a healthy lifestyle:  Eat most of your calories during the day when you are active. Eliminate processed foods including packaged sweets (pies, cakes, cookies), reduce intake of potatoes, white bread, white pasta, and white rice. Look for whole grain options, oat flour or almond flour.  Each meal should contain half fruits/vegetables, one quarter protein, and one quarter carbs (no bigger than a computer mouse).  Cut down on sweet beverages. This includes juice, soda, and sweet tea. Also watch fruit intake, though this is a healthier sweet option, it still contains natural sugar! Limit to 3 servings daily.  Drink at least 1 glass of water with each meal and aim for at least 8 glasses per day  Exercise at least 150 minutes every week.

## 2023-12-05 ENCOUNTER — Other Ambulatory Visit: Payer: Self-pay | Admitting: Family Medicine

## 2023-12-05 DIAGNOSIS — Z1231 Encounter for screening mammogram for malignant neoplasm of breast: Secondary | ICD-10-CM

## 2023-12-09 ENCOUNTER — Encounter: Payer: Self-pay | Admitting: Internal Medicine

## 2023-12-09 ENCOUNTER — Ambulatory Visit (INDEPENDENT_AMBULATORY_CARE_PROVIDER_SITE_OTHER): Admitting: Internal Medicine

## 2023-12-09 ENCOUNTER — Other Ambulatory Visit

## 2023-12-09 VITALS — BP 120/60 | HR 83 | Ht 59.0 in | Wt 101.4 lb

## 2023-12-09 DIAGNOSIS — E785 Hyperlipidemia, unspecified: Secondary | ICD-10-CM | POA: Diagnosis not present

## 2023-12-09 DIAGNOSIS — Z7984 Long term (current) use of oral hypoglycemic drugs: Secondary | ICD-10-CM | POA: Diagnosis not present

## 2023-12-09 DIAGNOSIS — E1142 Type 2 diabetes mellitus with diabetic polyneuropathy: Secondary | ICD-10-CM

## 2023-12-09 MED ORDER — ONETOUCH VERIO VI STRP: 1.0000 | ORAL_STRIP | Freq: Every day | 3 refills | Status: AC

## 2023-12-09 MED ORDER — METFORMIN HCL ER 500 MG PO TB24
ORAL_TABLET | ORAL | 3 refills | Status: AC
Start: 2023-12-09 — End: ?

## 2023-12-09 MED ORDER — PIOGLITAZONE HCL 45 MG PO TABS: 45.0000 mg | ORAL_TABLET | Freq: Every day | ORAL | 3 refills | Status: AC

## 2023-12-09 NOTE — Patient Instructions (Addendum)
 Please restart: - Metformin  ER 1000 mg in am and 500 mg with dinner - Actos  45 mg before breakfast  Please stop at the lab.  Restart checking blood sugars 2-4x a day.  Try to get the glucose sensor.   Please return for another visit in 1 month.

## 2023-12-09 NOTE — Progress Notes (Addendum)
 Patient ID: Cindy Guerrero, female   DOB: 18-Dec-1950, 73 y.o.   MRN: [id number]  HPI: Cindy Guerrero is a 73 y.o.-year-old female, returning for follow-up for DM2, dx in 1998, non-insulin -dependent, uncontrolled, with complications (PN). Pt. previously saw Dr. Kassie, but last visit with me 1 month ago. We used the [place] interpreter service for today's visit.  Interim history: No increased urination, blurry vision, nausea, chest pain.  He does have nocturia 3-4 times a night. She continues to exercise daily - 1h in am and 1h in the pm. She was on a cough syrup at last OV >> stopped since then.  Reviewed HbA1c: 11/05/2023: HbA1c calculated from fructosamine: 9.12% Lab Results  Component Value Date   HGBA1C 11.8 (H) 11/05/2023   HGBA1C 12.0 (A) 11/05/2023   HGBA1C 7.5 (H) 03/27/2023   HGBA1C 7.2 (H) 12/23/2022   HGBA1C 9.2 09/09/2022   HGBA1C 9.5 (H) 04/26/2022   HGBA1C 8.0 (H) 10/16/2021   HGBA1C 8.0 (A) 06/01/2021   HGBA1C 6.0 (A) 06/02/2020   HGBA1C 6.1 (A) 12/03/2019   Pt is on a regimen of: - Metformin  ER 1000 mg 2x a day, with meals >> AP in the evening >>  500 mg in am and 500 mg with dinner >> 1000 mg in a.m. and 500 mg with dinner >> ran out - Actos  45 mg before breakfast >> ran out She did not start insulin  6 units daily as recommended after last visit... She mentions she is afraid of needles. She was previously on Jardiance  10 mg in the pm >> moved to the morning >> stopped 11/2023 due to very high blood sugars and glucotoxicity.  She is not checking CBGs - ran out of test strips. Checked few times with her daughter's glucometer: - am: n/c >> 128-131 >> 109-126, 136 >> 98-120, 130, 140 >> n/c - 2h after b'fast: n/c >> 131, 181 - before lunch: n/c >> 141, 157 - 2h after lunch: 178 >> n/c - before dinner: n/c >> 125 - 2h after dinner: n/c >> 130-140 >> 121-131 >> 130s >> n/c - bedtime: n/c - nighttime: n/c Lowest sugar was  100 >> 109 >> 98 >> 125. Highest sugar was 178 >>  131 >> 140 >> 181.  Glucometer: One Touch Verio  - no h/o CKD, last BUN/creatinine:  Lab Results  Component Value Date   BUN 19 11/24/2023   BUN 29 (H) 03/27/2023   CREATININE 0.84 11/24/2023   CREATININE 0.97 03/27/2023   Lab Results  Component Value Date   MICRALBCREAT 81 (H) 11/05/2023  On losartan  25 mg daily.  -HL; last set of lipids: Lab Results  Component Value Date   CHOL 136 11/24/2023   HDL 50.00 11/24/2023   LDLCALC 35 11/24/2023   LDLDIRECT 53.0 04/26/2022   TRIG 258.0 (H) 11/24/2023   CHOLHDL 3 11/24/2023  On Crestor  10 mg daily and fenofibrate  160 mg daily.  - last eye exam was 04/26/2022.  + DR. She has a history of cataract surgery.  - + numbness and tingling in her feet - resolved occasional burning on soles - relieved by walking.  Last foot exam 04/04/2023.  She also has a history of osteoporosis, managed by PCP.  She is on Fosamax  70 mg weekly.  ROS: + see HPI  Past Medical History:  Diagnosis Date   Diabetes (HCC)    Dyslipidemia    Headache    Hypertension    Osteoporosis    Past Surgical History:  Procedure Laterality Date  EYE SURGERY     cataract surgery   Social History   Socioeconomic History   Marital status: Significant Other    Spouse name: Not on file   Number of children: 6   Years of education: Not on file   Highest education level: Not on file  Occupational History   Not on file  Tobacco Use   Smoking status: Never   Smokeless tobacco: Never  Vaping Use   Vaping status: Never Used  Substance and Sexual Activity   Alcohol use: No   Drug use: Never   Sexual activity: Not Currently  Other Topics Concern   Not on file  Social History Narrative   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   Social Drivers of Health   Financial Resource Strain: Low Risk  (11/24/2023)   Overall Financial Resource Strain (CARDIA)    Difficulty of Paying Living Expenses: Not very hard  Food Insecurity: No Food Insecurity  (11/24/2023)   Hunger Vital Sign    Worried About Running Out of Food in the Last Year: Never true    Ran Out of Food in the Last Year: Never true  Transportation Needs: Unmet Transportation Needs (11/24/2023)   PRAPARE - Transportation    Lack of Transportation (Medical): No    Lack of Transportation (Non-Medical): Yes  Physical Activity: Insufficiently Active (11/24/2023)   Exercise Vital Sign    Days of Exercise per Week: 3 days    Minutes of Exercise per Session: 40 min  Stress: No Stress Concern Present (11/24/2023)   [employer] of Occupational Health - Occupational Stress Questionnaire    Feeling of Stress: Not at all  Social Connections: Unknown (11/24/2023)   Social Connection and Isolation Panel    Frequency of Communication with Friends and Family: More than three times a week    Frequency of Social Gatherings with Friends and Family: More than three times a week    Attends Religious Services: Patient declined    Database administrator or Organizations: No    Attends Banker Meetings: Not on file    Marital Status: Patient declined  Intimate Partner Violence: Not At Risk (10/14/2022)   Humiliation, Afraid, Rape, and Kick questionnaire    Fear of Current or Ex-Partner: No    Emotionally Abused: No    Physically Abused: No    Sexually Abused: No   Current Outpatient Medications on File Prior to Visit  Medication Sig Dispense Refill   Acetaminophen (TYLENOL PO) Take by mouth as needed (pain).     alendronate  (FOSAMAX ) 70 MG tablet Take 1 tablet (70 mg total) by mouth once a week. Take with a full glass of water on an empty stomach. 12 tablet 3   B Complex Vitamins (B COMPLEX 1 PO) Take by mouth.     Blood Glucose Monitoring Suppl (ONETOUCH VERIO) w/Device KIT Use as advsed 1 kit 1   Calcium  Carb-Cholecalciferol 600-800 MG-UNIT TABS Take by mouth.     Continuous Glucose Sensor (FREESTYLE LIBRE 3 PLUS SENSOR) MISC 1 each by Does not apply route every 14  (fourteen) days. 6 each 3   Cyanocobalamin  (VITAMIN B 12 PO) Take by mouth.     empagliflozin  (JARDIANCE ) 10 MG TABS tablet Take 1 tablet (10 mg total) by mouth daily before breakfast. 90 tablet 3   fenofibrate  160 MG tablet Take 1 tablet (160 mg total) by mouth daily. 90 tablet 3   fluconazole  (DIFLUCAN ) 150 MG tablet  Take 1 tablet (150 mg total) by mouth every three (3) days as needed. 2 tablet 0   glucose blood (ONETOUCH VERIO) test strip 1-2 each by Other route daily. And lancets 1/day.  E11.9 200 each 3   insulin  glargine (LANTUS  SOLOSTAR) 100 UNIT/ML Solostar Pen Inject 6 Units into the skin daily.     Insulin  Pen Needle 32G X 4 MM MISC Use 1x a day     Lancets (ONETOUCH DELICA PLUS LANCET33G) MISC USE TO CHECK GLUCOSE 1-2x DAILY 200 each 3   losartan  (COZAAR ) 25 MG tablet Take 1 tablet (25 mg total) by mouth daily. 90 tablet 3   metFORMIN  (GLUCOPHAGE -XR) 500 MG 24 hr tablet Take by mouth 2 tablets in am and 1 tablet at night 270 tablet 3   omeprazole  (PRILOSEC) 40 MG capsule Take 1 capsule (40 mg total) by mouth daily. 90 capsule 3   pioglitazone  (ACTOS ) 45 MG tablet Take 1 tablet (45 mg total) by mouth daily. 90 tablet 3   Pyridoxine HCl (B-6 PO) Take 1 tablet by mouth daily.     rosuvastatin  (CRESTOR ) 10 MG tablet Take 1 tablet (10 mg total) by mouth daily. 90 tablet 3   triamcinolone  cream (KENALOG ) 0.1 % Apply 1 Application topically 2 (two) times daily. 30 g 1   VITAMIN D  PO Take 1 capsule by mouth daily.     vitamin E 1000 UNIT capsule Take 1,000 Units by mouth daily.     No current facility-administered medications on file prior to visit.   Allergies  Allergen Reactions   Atorvastatin Calcium      Other reaction(s): cough?  Other Reaction(s): cough?   Family History  Problem Relation Age of Onset   Diabetes Mother    Hyperlipidemia Daughter    Diabetes Daughter    Asthma Daughter    PE: BP 120/60   Pulse 83   Ht 4' 11 (1.499 m)   Wt 101 lb 6.4 oz (46 kg)   SpO2  96%   BMI 20.48 kg/m  Wt Readings from Last 3 Encounters:  12/09/23 101 lb 6.4 oz (46 kg)  11/24/23 102 lb 8 oz (46.5 kg)  11/05/23 101 lb 9.6 oz (46.1 kg)   Constitutional: normal weight, in NAD Eyes: no exophthalmos ENT: no thyromegaly, no cervical lymphadenopathy Cardiovascular: RRR, No MRG Respiratory: CTA B Musculoskeletal: no deformities Skin: no rashes Neurological: no tremor with outstretched hands  ASSESSMENT: 1. DM2, insulin -dependent, uncontrolled, with complications - Peripheral neuropathy  Component     Latest Ref Rng 11/05/2023  Hemoglobin A1C     <5.7 % 11.8 (H)   Hemoglobin A1C      12.0 !   Glucose     65 - 99 mg/dL 824 (H)   POC Glucose     70 - 99 mg/dl 813 !   Fructosamine     205 - 285 umol/L 442 (H)   C-Peptide     0.80 - 3.85 ng/mL 1.10   IA-2 Antibody     <5.4 U/mL <5.4   ZNT8 Antibodies     <15 U/mL <10   Glutamic Acid Decarb Ab     <5 IU/mL <5    2.  Hyperlipidemia  PLAN:  1. Patient with history of uncontrolled type 2 diabetes, on metformin  and TZD, with significantly worse control at last visit, when HbA1c increased from 7.5% to 11.8%.  We checked a venous HbA1c and this was also elevated, at 12%.  A fructosamine level yielded an HbA1c  of 9.12%, lower, but still significantly above target.  At last visit I advised her to stop Jardiance .  We also checked her insulin  production and antipancreatic antibodies.  This did not point towards type 1 diabetes, however, due to the significant glucotoxicity, we did try to get in touch with her to start insulin . She was not reachable by phone despite several attempts, however, I sent her a MyChart message and she did read this on 11/13/2023.  However, she did not start insulin  yet... - At last visit, her sugars at home appears to be controlled so I suspected that either her glucometer or the strips or both may have been defective.  I advised her to get new test strips since she just replaced her glucometer.   I also recommended a CGM and explained how to use it.  She was not able to start.  She also did not send me the blood sugars in 1 week as recommended at last visit... - At today's visit, she mentions that she was using a cough syrup at last visit which she stopped since.  She believes that this may have been the reason for the elevated blood sugars.  At today's visit, not only that she did not start insulin , but she also ran out of her oral medications... She ran out of test strips and did not check blood sugars until 2 to 3 days ago when she started to check with her daughters glucometer.  These are slightly elevated, as expected in the absence of oral indications, but not necessarily pointing towards the need for insulin .  We checked a CBG in the office and this was 140 fasting.  We will check a fructosamine level today.  However, at today's visit I refilled her metformin  and Actos  and recommended to start these.  She is afraid of needles so if we need insulin , this may be a problem.  We would need to bring her to the clinic to demonstrate pen use if absolutely needed. - Therefore, her diabetes control is greatly impaired by breakdown in communication or medical literacy.  We need to bring her to the clinic more frequently to be able to make changes in her diabetic regimen.  I did suggest to come back in a month but she will be visiting her daughter in [place] and she is only able to come back in 02/2024, in 2 months. - I suggested to:  Patient Instructions  Please restart: - Metformin  ER 1000 mg in am and 500 mg with dinner - Actos  45 mg before breakfast  Please stop at the lab.  Restart checking blood sugars 2-4x a day.  Try to get the glucose sensor.   Please return for another visit in 1 month.  - advised to check sugars at different times of the day - 4x a day, rotating check times - advised for yearly eye exams >> she is not UTD - he ACR was elevated at last visit.  Will recheck at next  visit when her diabetes is hopefully better controlled. - return to clinic in 1 mo  2.  Hyperlipidemia - Latest lipid panel showed elevated triglycerides, while LDL was at goal: Lab Results  Component Value Date   CHOL 136 11/24/2023   HDL 50.00 11/24/2023   LDLCALC 35 11/24/2023   LDLDIRECT 53.0 04/26/2022   TRIG 258.0 (H) 11/24/2023   CHOLHDL 3 11/24/2023  - Continues Crestor  10 mg daily and fenofibrate  160 mg daily without side effects  Component  Latest Ref Rng 12/09/2023  Fructosamine     205 - 285 umol/L 335 (H)   HbA1c calculated from fructosamine is 7.3% (much better!).  Lela Fendt, MD PhD [facility]

## 2023-12-13 LAB — FRUCTOSAMINE: Fructosamine: 335 umol/L — ABNORMAL HIGH (ref 205–285)

## 2023-12-15 ENCOUNTER — Ambulatory Visit: Payer: Self-pay | Admitting: Internal Medicine

## 2023-12-16 NOTE — Progress Notes (Signed)
 Mekisha Bittel                                          MRN: [id number]   12/16/2023   The [facility] Quality Team Specialist reviewed this patient medical record for the purposes of chart review for care gap closure. The following were reviewed: chart review for care gap closure-glycemic status assessment.    [facility] Quality Team

## 2023-12-17 ENCOUNTER — Ambulatory Visit
Admission: RE | Admit: 2023-12-17 | Discharge: 2023-12-17 | Disposition: A | Discharge status: Home / Self Care | Source: Ambulatory Visit | Attending: Family Medicine | Admitting: Family Medicine

## 2023-12-17 DIAGNOSIS — Z1231 Encounter for screening mammogram for malignant neoplasm of breast: Secondary | ICD-10-CM

## 2024-01-08 ENCOUNTER — Ambulatory Visit: Admitting: Internal Medicine

## 2024-01-08 NOTE — Progress Notes (Deleted)
 Patient ID: Cindy Guerrero, female   DOB: 02/21/1951, 73 y.o.   MRN: [id number]  HPI: Cindy Guerrero is a 73 y.o.-year-old female, returning for follow-up for DM2, dx in 1998, non-insulin -dependent, uncontrolled, with complications (PN). Pt. previously saw Dr. Kassie, but last visit with me 1 month ago. We used the [place] interpreter service for today's visit.  Interim history: No increased urination, blurry vision, nausea, chest pain.  He does have nocturia 3-4 times a night. She continues to exercise daily - 1h in am and 1h in the pm. She was on a cough syrup in 11/2023.  She stopped since.  Reviewed HbA1c: 12/09/2023: HbA1c calculated from fructosamine: 7.3% 11/05/2023: HbA1c calculated from fructosamine: 9.12% Lab Results  Component Value Date   HGBA1C 11.8 (H) 11/05/2023   HGBA1C 12.0 (A) 11/05/2023   HGBA1C 7.5 (H) 03/27/2023   HGBA1C 7.2 (H) 12/23/2022   HGBA1C 9.2 09/09/2022   HGBA1C 9.5 (H) 04/26/2022   HGBA1C 8.0 (H) 10/16/2021   HGBA1C 8.0 (A) 06/01/2021   HGBA1C 6.0 (A) 06/02/2020   HGBA1C 6.1 (A) 12/03/2019   At last visit she was out of both of her medicines: - Metformin  ER 1000 mg 2x a day, with meals >> AP in the evening >>  500 mg in am and 500 mg with dinner >> 1000 mg in a.m. and 500 mg with dinner >> ran out - Actos  45 mg before breakfast >> ran out She did not start insulin  6 units daily as recommended after last visit... She mentions she is afraid of needles. She was previously on Jardiance  10 mg in the pm >> moved to the morning >> stopped 11/2023 due to very high blood sugars and glucotoxicity.  We restarted: - Metformin  ER 1000 mg in am and 500 mg with dinner - Actos  45 mg before breakfast  At last visit, she was out of test strips and only had a few blood sugar checks in her meter.  Since last visit she started to check consistently: - am: 128-131 >> 109-126, 136 >> 98-120, 130, 140 >> n/c - 2h after b'fast: n/c >> 131, 181 - before lunch: n/c >> 141, 157 - 2h  after lunch: 178 >> n/c - before dinner: n/c >> 125 - 2h after dinner: n/c >> 130-140 >> 121-131 >> 130s >> n/c - bedtime: n/c - nighttime: n/c Lowest sugar was  100 >> 109 >> 98 >> 125. Highest sugar was 178 >> 131 >> 140 >> 181.  Glucometer: One Touch Verio  - no h/o CKD, last BUN/creatinine:  Lab Results  Component Value Date   BUN 19 11/24/2023   BUN 29 (H) 03/27/2023   CREATININE 0.84 11/24/2023   CREATININE 0.97 03/27/2023   Lab Results  Component Value Date   MICRALBCREAT 81 (H) 11/05/2023  On losartan  25 mg daily.  -HL; last set of lipids: Lab Results  Component Value Date   CHOL 136 11/24/2023   HDL 50.00 11/24/2023   LDLCALC 35 11/24/2023   LDLDIRECT 53.0 04/26/2022   TRIG 258.0 (H) 11/24/2023   CHOLHDL 3 11/24/2023  On Crestor  10 mg daily and fenofibrate  160 mg daily.  - last eye exam was 04/26/2022.  + DR. She has a history of cataract surgery.  - + numbness and tingling in her feet - resolved occasional burning on soles - relieved by walking.  Last foot exam 04/04/2023.  She also has a history of osteoporosis, managed by PCP.  She is on Fosamax  70 mg weekly.  ROS: +  see HPI  Past Medical History:  Diagnosis Date   Diabetes (HCC)    Dyslipidemia    Headache    Hypertension    Osteoporosis    Past Surgical History:  Procedure Laterality Date   EYE SURGERY     cataract surgery   Social History   Socioeconomic History   Marital status: Significant Other    Spouse name: Not on file   Number of children: 6   Years of education: Not on file   Highest education level: Not on file  Occupational History   Not on file  Tobacco Use   Smoking status: Never   Smokeless tobacco: Never  Vaping Use   Vaping status: Never Used  Substance and Sexual Activity   Alcohol use: No   Drug use: Never   Sexual activity: Not Currently  Other Topics Concern   Not on file  Social History Narrative   Retired systems developer   12 grands   9 greats   Social  Drivers of Health   Financial Resource Strain: Low Risk  (11/24/2023)   Overall Financial Resource Strain (CARDIA)    Difficulty of Paying Living Expenses: Not very hard  Food Insecurity: No Food Insecurity (11/24/2023)   Hunger Vital Sign    Worried About Running Out of Food in the Last Year: Never true    Ran Out of Food in the Last Year: Never true  Transportation Needs: Unmet Transportation Needs (11/24/2023)   PRAPARE - Transportation    Lack of Transportation (Medical): No    Lack of Transportation (Non-Medical): Yes  Physical Activity: Insufficiently Active (11/24/2023)   Exercise Vital Sign    Days of Exercise per Week: 3 days    Minutes of Exercise per Session: 40 min  Stress: No Stress Concern Present (11/24/2023)   [employer] of Occupational Health - Occupational Stress Questionnaire    Feeling of Stress: Not at all  Social Connections: Unknown (11/24/2023)   Social Connection and Isolation Panel    Frequency of Communication with Friends and Family: More than three times a week    Frequency of Social Gatherings with Friends and Family: More than three times a week    Attends Religious Services: Patient declined    Database Administrator or Organizations: No    Attends Banker Meetings: Not on file    Marital Status: Patient declined  Intimate Partner Violence: Not At Risk (10/14/2022)   Humiliation, Afraid, Rape, and Kick questionnaire    Fear of Current or Ex-Partner: No    Emotionally Abused: No    Physically Abused: No    Sexually Abused: No   Current Outpatient Medications on File Prior to Visit  Medication Sig Dispense Refill   Acetaminophen (TYLENOL PO) Take by mouth as needed (pain).     alendronate  (FOSAMAX ) 70 MG tablet Take 1 tablet (70 mg total) by mouth once a week. Take with a full glass of water on an empty stomach. 12 tablet 3   B Complex Vitamins (B COMPLEX 1 PO) Take by mouth.     Blood Glucose Monitoring Suppl (ONETOUCH VERIO)  w/Device KIT Use as advsed 1 kit 1   Calcium  Carb-Cholecalciferol 600-800 MG-UNIT TABS Take by mouth.     Continuous Glucose Sensor (FREESTYLE LIBRE 3 PLUS SENSOR) MISC 1 each by Does not apply route every 14 (fourteen) days. 6 each 3   Cyanocobalamin  (VITAMIN B 12 PO) Take by mouth.     empagliflozin  (JARDIANCE ) 10 MG  TABS tablet Take 1 tablet (10 mg total) by mouth daily before breakfast. 90 tablet 3   fenofibrate  160 MG tablet Take 1 tablet (160 mg total) by mouth daily. 90 tablet 3   fluconazole  (DIFLUCAN ) 150 MG tablet Take 1 tablet (150 mg total) by mouth every three (3) days as needed. 2 tablet 0   glucose blood (ONETOUCH VERIO) test strip 1-2 each by Other route daily. And lancets 1/day.  E11.9 200 each 3   insulin  glargine (LANTUS  SOLOSTAR) 100 UNIT/ML Solostar Pen Inject 6 Units into the skin daily. (Patient not taking: Reported on 12/09/2023)     Insulin  Pen Needle 32G X 4 MM MISC Use 1x a day     Lancets (ONETOUCH DELICA PLUS LANCET33G) MISC USE TO CHECK GLUCOSE 1-2x DAILY 200 each 3   losartan  (COZAAR ) 25 MG tablet Take 1 tablet (25 mg total) by mouth daily. 90 tablet 3   metFORMIN  (GLUCOPHAGE -XR) 500 MG 24 hr tablet Take by mouth 2 tablets in am and 1 tablet at night 270 tablet 3   omeprazole  (PRILOSEC) 40 MG capsule Take 1 capsule (40 mg total) by mouth daily. 90 capsule 3   pioglitazone  (ACTOS ) 45 MG tablet Take 1 tablet (45 mg total) by mouth daily. 90 tablet 3   Pyridoxine HCl (B-6 PO) Take 1 tablet by mouth daily.     rosuvastatin  (CRESTOR ) 10 MG tablet Take 1 tablet (10 mg total) by mouth daily. 90 tablet 3   triamcinolone  cream (KENALOG ) 0.1 % Apply 1 Application topically 2 (two) times daily. 30 g 1   VITAMIN D  PO Take 1 capsule by mouth daily.     vitamin E 1000 UNIT capsule Take 1,000 Units by mouth daily.     No current facility-administered medications on file prior to visit.   Allergies  Allergen Reactions   Atorvastatin Calcium      Other reaction(s):  cough?  Other Reaction(s): cough?   Family History  Problem Relation Age of Onset   Diabetes Mother    Hyperlipidemia Daughter    Diabetes Daughter    Asthma Daughter    PE: There were no vitals taken for this visit. Wt Readings from Last 3 Encounters:  12/09/23 101 lb 6.4 oz (46 kg)  11/24/23 102 lb 8 oz (46.5 kg)  11/05/23 101 lb 9.6 oz (46.1 kg)   Constitutional: normal weight, in NAD Eyes: no exophthalmos ENT: no thyromegaly, no cervical lymphadenopathy Cardiovascular: RRR, No MRG Respiratory: CTA B Musculoskeletal: no deformities Skin: no rashes Neurological: no tremor with outstretched hands  ASSESSMENT: 1. DM2, insulin -dependent, uncontrolled, with complications - Peripheral neuropathy  Component     Latest Ref Rng 11/05/2023  Hemoglobin A1C     <5.7 % 11.8 (H)   Hemoglobin A1C      12.0 !   Glucose     65 - 99 mg/dL 824 (H)   POC Glucose     70 - 99 mg/dl 813 !   Fructosamine     205 - 285 umol/L 442 (H)   C-Peptide     0.80 - 3.85 ng/mL 1.10   IA-2 Antibody     <5.4 U/mL <5.4   ZNT8 Antibodies     <15 U/mL <10   Glutamic Acid Decarb Ab     <5 IU/mL <5    2.  Hyperlipidemia  PLAN:  1. Patient with history of uncontrolled type 2 diabetes, on metformin  and TZD, with significantly worse control in 11/2023, when HbA1c increased from 7.5%  to 11.8%.  We checked a venous HbA1c and this was also elevated, at 12%.  A fructosamine level DL did an YaJ8r of 0.87, lower, but still significantly above target.  At that time, advised her to stop Jardiance .  We also checked her insulin  production and antipancreatic antibodies but the investigation did not point towards type 1 diabetes.  Due to the significant glucotoxicity, I still advised her to start insulin , but she did not do so... - At last visit, she mentions that she was using a cough syrup previously, which she felt was elevated her blood sugars.  She stopped since.  She was off both of her diabetic medications:  Metformin , and Actos , and we restarted these.  Since sugars were better +a fructosamine level yielded an HbA1c of 7.3%, I did not recommend again to start insulin  but advised her to come back in 1 month for a recheck.  I also recommended a CGM. - Her diabetes control is greatly impaired by breakdown in communication or medical literacy.  We need to bring her to the clinic more frequently to be able to make changes in her diabetic regimen.    I did suggest to come back in a month but she will be visiting her daughter in [place] and she is only able to come back in 02/2024, in 2 months.  - I suggested to:  Patient Instructions  Please continue: - Metformin  ER 1000 mg in am and 500 mg with dinner - Actos  45 mg before breakfast   Please return for another visit in 2 months.  - we checked her HbA1c: 7%  - advised to check sugars at different times of the day - 4x a day, rotating check times - advised for yearly eye exams >> she is UTD -She had an elevated ACR at last check, 81.  Will repeat this today. - return to clinic in 2 months  2.  Hyperlipidemia - Latest lipid panel was reviewed and triglycerides are elevated, while LDL was at goal: Lab Results  Component Value Date   CHOL 136 11/24/2023   HDL 50.00 11/24/2023   LDLCALC 35 11/24/2023   LDLDIRECT 53.0 04/26/2022   TRIG 258.0 (H) 11/24/2023   CHOLHDL 3 11/24/2023  - She continues on Crestor  10 mg daily and fenofibrate  160 mg daily without side effects  Lela Fendt, MD PhD [facility] Endocr[facility]

## 2024-01-28 NOTE — Progress Notes (Signed)
 Cindy Guerrero                                          MRN: [id number]   01/28/2024   The [facility] Quality Team Specialist reviewed this patient medical record for the purposes of chart review for care gap closure. The following were reviewed: chart review for care gap closure-glycemic status assessment. A1c out of range 11.8. KED closed in [facility] portal.     [facility] Quality Team

## 2024-02-23 NOTE — Progress Notes (Signed)
 Arabelle Bollig                                          MRN: [id number]   02/23/2024   The [facility] Quality Team Specialist reviewed this patient medical record for the purposes of chart review for care gap closure. The following were reviewed: chart review for care gap closure-glycemic status assessment.    [facility] Quality Team

## 2024-02-27 ENCOUNTER — Encounter: Admitting: Family Medicine

## 2024-03-09 ENCOUNTER — Encounter: Payer: Self-pay | Admitting: *Deleted

## 2024-03-09 NOTE — Progress Notes (Signed)
 Cindy Guerrero                                          MRN: [id number]   03/09/2024   The [facility] Quality Team Specialist reviewed this patient medical record for the purposes of chart review for care gap closure. The following were reviewed: chart review for care gap closure-glycemic status assessment.    [facility] Quality Team
